# Patient Record
Sex: Male | Born: 1950 | Race: White | Hispanic: No | Marital: Married | Smoking: Former smoker
Health system: Southern US, Community
[De-identification: ages and names within clinical notes are randomized; demographics above are authoritative.]

## PROBLEM LIST (undated history)

## (undated) DIAGNOSIS — F32A Depression, unspecified: Secondary | ICD-10-CM

## (undated) DIAGNOSIS — T8859XA Other complications of anesthesia, initial encounter: Secondary | ICD-10-CM

## (undated) DIAGNOSIS — M1990 Unspecified osteoarthritis, unspecified site: Secondary | ICD-10-CM

## (undated) DIAGNOSIS — R2689 Other abnormalities of gait and mobility: Secondary | ICD-10-CM

## (undated) DIAGNOSIS — K649 Unspecified hemorrhoids: Secondary | ICD-10-CM

## (undated) DIAGNOSIS — R200 Anesthesia of skin: Secondary | ICD-10-CM

## (undated) DIAGNOSIS — R202 Paresthesia of skin: Secondary | ICD-10-CM

## (undated) DIAGNOSIS — F329 Major depressive disorder, single episode, unspecified: Secondary | ICD-10-CM

## (undated) DIAGNOSIS — G4730 Sleep apnea, unspecified: Secondary | ICD-10-CM

## (undated) DIAGNOSIS — H269 Unspecified cataract: Secondary | ICD-10-CM

## (undated) DIAGNOSIS — M549 Dorsalgia, unspecified: Secondary | ICD-10-CM

## (undated) DIAGNOSIS — C801 Malignant (primary) neoplasm, unspecified: Secondary | ICD-10-CM

## (undated) DIAGNOSIS — T4145XA Adverse effect of unspecified anesthetic, initial encounter: Secondary | ICD-10-CM

## (undated) DIAGNOSIS — R51 Headache: Secondary | ICD-10-CM

## (undated) DIAGNOSIS — IMO0002 Reserved for concepts with insufficient information to code with codable children: Secondary | ICD-10-CM

## (undated) DIAGNOSIS — M48061 Spinal stenosis, lumbar region without neurogenic claudication: Secondary | ICD-10-CM

## (undated) DIAGNOSIS — E785 Hyperlipidemia, unspecified: Secondary | ICD-10-CM

## (undated) HISTORY — PX: OTHER SURGICAL HISTORY: SHX169

## (undated) HISTORY — PX: NASAL SEPTUM SURGERY: SHX37

## (undated) HISTORY — DX: Anesthesia of skin: R20.0

## (undated) HISTORY — PX: SKIN CANCER EXCISION: SHX779

## (undated) HISTORY — DX: Hyperlipidemia, unspecified: E78.5

## (undated) HISTORY — DX: Dorsalgia, unspecified: M54.9

## (undated) HISTORY — PX: CATARACT EXTRACTION: SUR2

## (undated) HISTORY — DX: Sleep apnea, unspecified: G47.30

## (undated) HISTORY — DX: Unspecified cataract: H26.9

## (undated) HISTORY — DX: Malignant (primary) neoplasm, unspecified: C80.1

## (undated) HISTORY — PX: KNEE ARTHROSCOPY: SUR90

## (undated) HISTORY — PX: FOOT FUSION: SHX956

## (undated) HISTORY — PX: APPENDECTOMY: SHX54

## (undated) HISTORY — DX: Paresthesia of skin: R20.2

## (undated) HISTORY — PX: LUMBAR DISC SURGERY: SHX700

## (undated) HISTORY — DX: Reserved for concepts with insufficient information to code with codable children: IMO0002

---

## 1997-08-23 ENCOUNTER — Ambulatory Visit (HOSPITAL_COMMUNITY): Admission: RE | Admit: 1997-08-23 | Discharge: 1997-08-23 | Payer: Self-pay | Admitting: Family Medicine

## 1999-03-06 ENCOUNTER — Ambulatory Visit (HOSPITAL_BASED_OUTPATIENT_CLINIC_OR_DEPARTMENT_OTHER): Admission: RE | Admit: 1999-03-06 | Discharge: 1999-03-06 | Payer: Self-pay | Admitting: Orthopedic Surgery

## 2002-01-12 ENCOUNTER — Ambulatory Visit (HOSPITAL_COMMUNITY): Admission: RE | Admit: 2002-01-12 | Discharge: 2002-01-12 | Payer: Self-pay | Admitting: Specialist

## 2002-01-12 ENCOUNTER — Encounter: Payer: Self-pay | Admitting: Specialist

## 2005-04-04 ENCOUNTER — Encounter: Payer: Self-pay | Admitting: Internal Medicine

## 2005-05-02 ENCOUNTER — Encounter: Payer: Self-pay | Admitting: Internal Medicine

## 2005-08-23 ENCOUNTER — Inpatient Hospital Stay (HOSPITAL_COMMUNITY): Admission: RE | Admit: 2005-08-23 | Discharge: 2005-08-24 | Payer: Self-pay | Admitting: Neurosurgery

## 2005-12-19 ENCOUNTER — Encounter: Payer: Self-pay | Admitting: Internal Medicine

## 2006-02-18 ENCOUNTER — Ambulatory Visit: Payer: Self-pay | Admitting: Internal Medicine

## 2006-05-28 ENCOUNTER — Ambulatory Visit: Payer: Self-pay | Admitting: Internal Medicine

## 2006-07-31 ENCOUNTER — Observation Stay (HOSPITAL_COMMUNITY): Admission: RE | Admit: 2006-07-31 | Discharge: 2006-08-01 | Payer: Self-pay | Admitting: Otolaryngology

## 2007-02-11 ENCOUNTER — Encounter: Admission: RE | Admit: 2007-02-11 | Discharge: 2007-02-11 | Payer: Self-pay | Admitting: Neurosurgery

## 2007-02-17 DIAGNOSIS — G4733 Obstructive sleep apnea (adult) (pediatric): Secondary | ICD-10-CM

## 2007-02-18 ENCOUNTER — Ambulatory Visit: Payer: Self-pay | Admitting: Internal Medicine

## 2007-03-06 ENCOUNTER — Inpatient Hospital Stay (HOSPITAL_COMMUNITY): Admission: RE | Admit: 2007-03-06 | Discharge: 2007-03-07 | Payer: Self-pay | Admitting: Neurosurgery

## 2007-07-16 ENCOUNTER — Encounter: Payer: Self-pay | Admitting: Internal Medicine

## 2008-01-25 ENCOUNTER — Encounter: Payer: Self-pay | Admitting: Internal Medicine

## 2008-02-18 ENCOUNTER — Ambulatory Visit: Payer: Self-pay | Admitting: Internal Medicine

## 2008-03-03 ENCOUNTER — Telehealth (INDEPENDENT_AMBULATORY_CARE_PROVIDER_SITE_OTHER): Payer: Self-pay | Admitting: *Deleted

## 2008-03-10 ENCOUNTER — Encounter: Payer: Self-pay | Admitting: Internal Medicine

## 2008-03-13 ENCOUNTER — Encounter: Payer: Self-pay | Admitting: Internal Medicine

## 2008-04-01 ENCOUNTER — Encounter: Payer: Self-pay | Admitting: Internal Medicine

## 2008-04-26 ENCOUNTER — Encounter: Admission: RE | Admit: 2008-04-26 | Discharge: 2008-04-26 | Payer: Self-pay | Admitting: Neurological Surgery

## 2008-05-20 ENCOUNTER — Telehealth (INDEPENDENT_AMBULATORY_CARE_PROVIDER_SITE_OTHER): Payer: Self-pay | Admitting: *Deleted

## 2009-02-16 ENCOUNTER — Ambulatory Visit: Payer: Self-pay | Admitting: Internal Medicine

## 2009-06-07 ENCOUNTER — Encounter: Payer: Self-pay | Admitting: Internal Medicine

## 2010-02-16 ENCOUNTER — Ambulatory Visit
Admission: RE | Admit: 2010-02-16 | Discharge: 2010-02-16 | Payer: Self-pay | Source: Home / Self Care | Attending: Internal Medicine | Admitting: Internal Medicine

## 2010-03-14 NOTE — Assessment & Plan Note (Signed)
Summary: 60 YEAR/APC   Visit Type:  Follow-up PCP:  kindl  Chief Complaint:  yearly follow up visit .  History of Present Illness: Current Problems:  CORONARY HEART DISEASE (ICD-V17.3) SLEEP APNEA, OBSTRUCTIVE (ICD-327.23)  02/18/07- For low back spine surgery- Dr. Newell Coral. CPAP fine 14 cwp Am Home  Patient comfortable but beginning to see increasing nocturia again and maybe a little more tired. Dr. Jenne Pane did nasal surgery in July 2008.- ? Septoplasty and SMR. More nasal congestion few months without sneeze. No feedback about snore etc at home. Post op home AHI was 9.1- most recently at 13.1 He asks about trial 16cwp.  02/18/08-OSA He adjusts his own machine. Uncomfortable higher than 16 cwp with too much leak. Back pain requires frequent position changes. Using nasal pilows mask. No longer falling asleep driving and less nocturia. Discussed treatment options.          Prior Medications Reviewed Using: Patient Recall  Updated Prior Medication List: PAXIL 20 MG  TABS (PAROXETINE HCL) Take 1 by mouth once daily ZOCOR 10 MG TABS (SIMVASTATIN) take 1 by mouth once daily MULTIVITAMINS   TABS (MULTIPLE VITAMIN) take 1po once daily * CPAP 18 CWP [facility] PATIENT  CALTRATE 600+D 600-400 MG-UNIT TABS (CALCIUM CARBONATE-VITAMIN D) take 1 by mouth once daily  Current Allergies (reviewed today): ! BETADINE  Past Medical History:    Reviewed history from 02/18/2007 and no changes required:       Lumbar disk surgery L2-L3       Thoracic spine surgery- spur       appendectomy       bilateral knee arthroscopies       Sleep Apnea   Family History:    Coronary Heart Disease    Mother-cancer    Brother - OSA  Social History:    Reviewed history from 02/18/2007 and no changes required:       Patient states former smoker.        married    Review of Systems      See HPI  The patient denies anorexia, fever, weight loss, weight gain, vision loss, decreased hearing, hoarseness,  chest pain, syncope, dyspnea on exertion, peripheral edema, prolonged cough, headaches, hemoptysis, abdominal pain, melena, hematochezia, severe indigestion/heartburn, hematuria, incontinence, muscle weakness, suspicious skin lesions, transient blindness, difficulty walking, depression, unusual weight change, abnormal bleeding, enlarged lymph nodes, and angioedema.     Vital Signs:  Patient Profile:   60 Years Old Male Weight:      212 pounds O2 Sat:      98 % O2 treatment:    Room Air Pulse rate:   54 / minute BP sitting:   122 / 76  (left arm) Cuff size:   regular  Vitals Entered By: Reynaldo Minium CMA (February 18, 2008 9:09 AM)             Comments Medications reviewed with patient Reynaldo Minium CMA  February 18, 2008 9:09 AM      Physical Exam  General: A/Ox3; pleasant and cooperative, NAD, SKIN: no rash, lesions NODES: no lymphadenopathy HEENT: [place]/AT, EOM- WNL, Conjuctivae- clear, PERRLA, TM-WNL, Nose- clear, Throat- clear and wnl, Melampatti III NECK: Supple w/ fair ROM, JVD- none, normal carotid impulses w/o bruits Thyroid- normal to palpation CHEST: Clear to P&A HEART: RRR, no m/g/r heard ABDOMEN: Soft and nl; nml bowel sounds; no organomegaly or masses noted ZOX:WRUE, nl pulses, no edema  NEURO: Grossly intact to observation  Impression & Recommendations:  Problem # 1:  SLEEP APNEA, OBSTRUCTIVE (ICD-327.23) Will see if he can be more comfortable with bipap. Long talk.- 20 minures, about treatment options.  Medications Added to Medication List This Visit: 1)  Zocor 10 Mg Tabs (Simvastatin) .... Take 1 by mouth once daily 2)  Cpap 16 Cwp [facility] Patient  .... He sets his own pressure 3)  Caltrate 600+d 600-400 Mg-unit Tabs (Calcium carbonate-vitamin d) .... Take 1 by mouth once daily   Patient Instructions: 1)  Please schedule a follow-up appointment in 1 year. 2)  See [facility] about BiLevel titration.   ]

## 2010-03-14 NOTE — Letter (Signed)
Summary: CMN for CPAP Supplies/[facility]  CMN for CPAP Supplies/[facility]   Imported By: Sherian Rein 06/13/2009 07:56:50  _____________________________________________________________________  External Attachment:    Type:   Image     Comment:   External Document

## 2010-03-14 NOTE — Assessment & Plan Note (Signed)
Summary: f/u 1 yr///kp   Primary Provider/Referring Provider:  kindl  CC:  Yearly Follow up.  wearing vpap mostly everynight for approx 9hours/night.  no problems with vpap.  No complaints. .  History of Present Illness:  02/18/07- For low back spine surgery- Dr. Newell Coral. CPAP fine 14 cwp Am Home  Patient comfortable but beginning to see increasing nocturia again and maybe a little more tired. Dr. Jenne Pane did nasal surgery in July 2008.- ? Septoplasty and SMR. More nasal congestion few months without sneeze. No feedback about snore etc at home. Post op home AHI was 9.1- most recently at 13.1 He asks about trial 16cwp.  02/18/08-OSA He adjusts his own machine. Uncomfortable higher than 16 cwp with too much leak. Back pain requires frequent position changes. Using nasal pilows mask. No longer falling asleep driving and less nocturia. Discussed treatment options.   February 16, 2009-OSA Uses VPAP machine- he sets it himself (Advanced), with full face mask which helps the leak. He brings record showing AHI 6.7/hr. He still moves in sleep a lot due to his chronic back pain. He feels better off, no daytime somnolence and fewer wakings at night for bathroom. No snore unless he has a bad cold and omits the mask. Exhaust air bothers wife. Incidental mild cold.    Current Medications (verified): 1)  Paxil 20 Mg  Tabs (Paroxetine Hcl) .... Take 1 By Mouth Once Daily 2)  Zocor 10 Mg Tabs (Simvastatin) .... Take 1 By Mouth Once Daily 3)  Multivitamins   Tabs (Multiple Vitamin) .... Take 1po Once Daily 4)  Vpap .... He Sets His Own Pressure 5)  Caltrate 600+d 600-400 Mg-Unit Tabs (Calcium Carbonate-Vitamin D) .... Take 1 By Mouth Two Times A Day 6)  Glucosamine-Chondroitin  Caps (Glucosamine-Chondroit-Vit C-Mn) .Marland Kitchen.. 1 Tab Two Times A Day 7)  Androgel Pump 1 % Gel (Testosterone) .... 4 Pumps Once Daily  Allergies (verified): 1)  ! Betadine  Past History:  Past Medical History: Last updated:  02/18/2008 Lumbar disk surgery L2-L3 Thoracic spine surgery- spur appendectomy bilateral knee arthroscopies Sleep Apnea  Family History: Last updated: 02/18/2008 Coronary Heart Disease Mother-cancer Brother - OSA  Social History: Last updated: 02/16/2009 Patient states former smoker.  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    Vital Signs:  Patient profile:   60 year old male Height:      75 inches Weight:      206.38 pounds BMI:     25.89 O2 Sat:      98 % on Room air Pulse rate:   51 / minute BP sitting:   118 / 82  (left arm) Cuff size:   regular  Vitals Entered By: Gweneth Dimitri RN (February 16, 2009 9:06 AM)  O2 Flow:  Room air CC: Yearly Follow up.  wearing vpap mostly everynight for approx 9hours/night.  no problems with vpap.  No complaints.  Comments Medications reviewed with patient Gweneth Dimitri RN  February 16, 2009 9:07 AM    Physical Exam  Additional Exam:  General: A/Ox3; pleasant and cooperative, NAD, trim SKIN: no rash, lesions NODES: no lymphadenopathy HEENT: [place]/AT, EOM- WNL, Conjuctivae- clear, PERRLA, TM-WNL, Nose- clear, Throat- clear and wnl, Melampatti II NECK: Supple w/ fair ROM, JVD- none, normal carotid impulses w/o bruits Thyroid- normal to palpation CHEST: Clear to P&A HEART: RRR, no m/g/r heard ABDOMEN: Soft and nl; nml bowel sounds; ZOX:WRUE, nl pulses, no edema  NEURO: Grossly intact to  observation      Impression & Recommendations:  Problem # 1:  SLEEP APNEA, OBSTRUCTIVE (ICD-327.23)  Sleep quality is significantly impaired by his chronic back pain and probably is as good as it is going to  be. He does very well with his VPAP. We reviewed this carefully.  Medications Added to Medication List This Visit: 1)  Vpap  .... He sets his own pressure 2)  Vpap Insp 14.2/ , Exp 9.2 Advanced  .... He sets his own pressure 3)  Caltrate 600+d 600-400 Mg-unit Tabs (Calcium carbonate-vitamin d) .... Take 1 by mouth two times a day 4)  Glucosamine-chondroitin Caps (Glucosamine-chondroit-vit c-mn) .Marland Kitchen.. 1 tab two times a day 5)  Androgel Pump 1 % Gel (Testosterone) .... 4 pumps once daily  Other Orders: Est. Patient Level III (45409)  Patient Instructions: 1)  Schedule return in one year, earlier if needed 2)  continue VPAP- let me know if there are changes or problems   Immunization History:  Influenza Immunization History:    Influenza:  historical (11/12/2008)

## 2010-03-16 NOTE — Assessment & Plan Note (Signed)
Summary: 1 YEAR/ MBW   Primary Provider/Referring Provider:  Larina Earthly  CC:  Yearly follow up visit-sleep apnea.  History of Present Illness: 02/18/08-OSA He adjusts his own machine. Uncomfortable higher than 16 cwp with too much leak. Back pain requires frequent position changes. Using nasal pilows mask. No longer falling asleep driving and less nocturia. Discussed treatment options.   February 16, 2009-OSA Uses VPAP machine- he sets it himself (Advanced), with full face mask which helps the leak. He brings record showing AHI 6.7/hr. He still moves in sleep a lot due to his chronic back pain. He feels better off, no daytime somnolence and fewer wakings at night for bathroom. No snore unless he has a bad cold and omits the mask. Exhaust air bothers wife. Incidental mild cold.  February 16, 2010- OSA Nurse-CC: Yearly follow up visit-sleep apnea He brings his own download and continues to use his VPAP device all night every night. Pressure is usually around 14 cwp with AHI around 6. He is pleased with it. We reviewed his line item report of machine function in detail.  No major heatlth issues otherwise in the past year.  We had him listed as having hx of CAD, but he says that was family hx, not personal hx of CAD himself, just hyperlipidemia.      Preventive Screening-Counseling & Management  Alcohol-Tobacco     Smoking Status: quit     Packs/Day: 1.0     Year Started: 1975     Year Quit: 1983     Pack years: 10 years 1 pack daily  Current Medications (verified): 1)  Paxil 20 Mg  Tabs (Paroxetine Hcl) .... Take 1 By Mouth Once Daily 2)  Crestor 20 Mg Tabs (Rosuvastatin Calcium) .... Take 1  By Mouth Once Daily 3)  Multivitamins   Tabs (Multiple Vitamin) .... Take 1po Once Daily 4)  Vpap Insp 14.2/ , Exp 9.2 Advanced .... He Sets His Own Pressure 5)  Caltrate 600+d 600-400 Mg-Unit Tabs (Calcium Carbonate-Vitamin D) .... Take 1 By Mouth Two Times A Day 6)  Glucosamine-Chondroitin   Caps (Glucosamine-Chondroit-Vit C-Mn) .Marland Kitchen.. 1 Tab Two Times A Day 7)  Androgel Pump 1 % Gel (Testosterone) .... 4 Pumps Once Daily 8)  Naprosyn 500 Mg Tabs (Naproxen) .... Take 1 By Mouth Two Times A Day 9)  Fish Oil 1000 Mg Caps (Omega-3 Fatty Acids) .... Take 1 By Mouth Once Daily  Allergies (verified): 1)  ! Betadine  Past History:  Past Surgical History: Last updated: 02/16/2009 Lumbar disc surgery Thoracic vertebral decompression Appendectomy Knee arthroscopy x 2 Fusion right great toe left cataract nasal septoplasty  Family History: Last updated: 02/16/2010 Coronary Heart Disease Mother-cancer Brother - OSA Brother- died MI, ETOH Father- died MI, DM  Social History: Last updated: 02/16/2009 Patient states former smoker.  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    Vital Signs:  Patient profile:   60 year old male Height:      75 inches  Weight:      208.13 pounds BMI:     26.11 O2 Sat:      97 % on Room air Pulse rate:   50 / minute BP sitting:   138 / 62  (left arm) Cuff size:   regular  Vitals Entered By: Reynaldo Minium CMA (February 16, 2010 9:04 AM)  O2 Flow:  Room air CC: Yearly follow up visit-sleep apnea   Physical Exam  Additional Exam:  General: A/Ox3; pleasant and cooperative, NAD, trim SKIN: no rash, lesions NODES: no lymphadenopathy HEENT: [place]/AT,  EOM- WNL, Conjuctivae- clear, PERRLA, TM-WNL, Nose- mucus bridging, Throat- clear and wnl, Mallampatii II NECK: Supple w/ fair ROM, JVD- none, normal carotid impulses w/o bruits Thyroid- normal to palpation CHEST: Clear to P&A HEART: RRR, no m/g/r heard ABDOMEN: Soft and nl; nml bowel sounds; ZDG:UYQI, nl pulses, no edema  NEURO: Grossly intact to observation      Impression & Recommendations:  Problem # 1:  SLEEP APNEA, OBSTRUCTIVE (ICD-327.23)  Good compliance and control with VPAP- he really has done very well.  We discussed and compared modalities and he is comfotable with what he has.   Orders: Est. Patient Level III (34742)  Problem # 2:  CORONARY HEART DISEASE (ICD-V17.3) Family hx only.  Medications Added to Medication List This Visit: 1)  Crestor 20 Mg Tabs (Rosuvastatin calcium) .... Take 1  by mouth once daily 2)  Naprosyn 500 Mg Tabs (Naproxen) .... Take 1 by mouth two times a day 3)  Fish Oil 1000 Mg Caps (Omega-3 fatty acids) .... Take 1 by mouth once daily  Patient Instructions: 1)  Please schedule a follow-up appointment in 1 year. 2)  Continue VPAP- please call as needed.

## 2010-06-27 NOTE — Op Note (Signed)
NAMETARIG, ZIMMERS NO.:  [id number]   MEDICAL RECORD NO.:  [id number]          PATIENT TYPE:  INP   LOCATION:  2899                         FACILITY:  [facility]   PHYSICIAN:  Hewitt Shorts, M.D.DATE OF BIRTH:  1950/12/06   DATE OF PROCEDURE:  DATE OF DISCHARGE:                               OPERATIVE REPORT   PREOPERATIVE DIAGNOSIS:  Thoracic stenosis, thoracic spondylosis for the  myelopathy, and thoracic degenerative disk disease with myelopathy.   POSTOPERATIVE DIAGNOSIS:  Thoracic stenosis, thoracic spondylosis for  the myelopathy, and thoracic degenerative disk disease with myelopathy.   PROCEDURE:  T9-10 decompression with laminectomy and decompression of  thecal sac and nerve roots at T9, T10, and T11, with microdissection and  a T9-T11 posterolateral arthrodesis with radius posterior  instrumentation and locally harvested morcellized autograft.   SURGEON:  Hewitt Shorts, M.D.   ASSISTANT:  Hilda Lias, M.D.   ANESTHESIA:  General endotracheal anesthesia.   INDICATIONS FOR PROCEDURE:  The patient is a 60 year old man who  presented with myelopathy, MRI of the thoracic spine revealed severe  stenosis at the T9-T10 level secondary to spondylosis and degenerative  disc disease, a small central disk protrusion, and early stenosis at T10-  T11 level again due to the spondylosis and degenerative disc disease.  Decision was made to proceed with decompression of the thecal sac and  nerve roots and stabilization.   DESCRIPTION OF PROCEDURE:  The patient brought to the operating room,  placed under a general endotracheal anesthesia.  The patient was turned  to the prone position on the [place] table with bony prominences padded,  the thoracolumbar region was prepped with DuraPrep and draped in a  sterile fashion.  X-rays were taken to localize the T9 to T11 level.  The midline was infiltrated with local anesthetic with epinephrine and a  midline  incision was made over the T9 to T11 at level and dissection was  carried to the subcutaneous tissue, bipolar electrocautery to maintain  hemostasis.  Dissection was carried down to the thoracic fascia, which  was incised bilaterally, the parathoracic musculature was dissected with  spinous process of lamina in subperiosteal fashion.  A self-retaining  retractor was placed and additional x-rays and C-arm imaging was used to  confirm localization of T9, T10, and T11.  Then dissection was carried  out laterally exposing the transverse process and posterior aspect of  costovertebral junction.  Once the localization was confirmed, we  proceeded with decompression.   Laminectomy was performed using double action rongeurs, the X-Max drill,  and Kerrison punches, using magnification and microdissection.  There  was severe stenosis at the T9 and T10 level that was carefully  decompressed.  The dissection was carried out laterally with a  significant spondylitic overgrowth, and we were able to decompress, not  only the thecal sac, but actually the nerve roots.  At T10-11, the  stenosis was more prominent at the right side.  We insured that the  thecal sac and nerve roots were decompressed bilaterally.  Once the  decompression was completed, the wound was irrigated with Bacitracin  solution, checked for hemostasis, which was established by use of the  Gelfoam with thrombin and then we proceeded with the arthrodesis.  The C-  arm fluoroscope was draped upon the field and we identified the pedicle  entry site posteriorly at T9, T10, and T11.  The pedicle entry site was  entered with the X-Max drill and then using a pedicle probe each of the  sixth pedicles was carefully probed.  We then turned the C-arm to the  lateral trajectory and checked the depth, advanced each of the pedicle  probes to proper depth and then proceeded with placement of the pedicle  screws.  Each pedicle was examined with a bulb  probe.  Good bony  surfaces were noted.  It was then tapped to the 5.25 mm tap.  Again  examined with the bulb, good threading was noted, as well as good bony  surfaces were noted, and then we placed a 5.75 x 40 mm screw at each  pedicle.  Once all 6 pedicles screws were in place, we selected an 18-mm  straight rod.  It was placed within the screw heads and secured with  locking caps, which were subsequently tightened against the contour  torque.  We then decorticated the facet joints bilaterally and packed  the facets with the locally harvested morselized autograft harvested  from the laminectomy.  Once the bone graft was placed, we checked the  spinal canal to ensure that the none of the bone graft had fallen within  it and then reconfirmed hemostasis and proceeded with closure.  The  paraspinal muscles were approximated with interrupted inverted 1 Vicryl  sutures.  The deep fascia was closed with interrupted inverted 1 Vicryl  sutures.  Subcutaneous and subcuticular layer closed with interrupted  inverted 3-0 running Vicryl sutures.  The skin was reapproximated with  surgical staples.  The wound was dressed with Adaptic and sterile gauze  and Hypafix.  The procedure was tolerated well.  The estimated blood  loss was 200 mL.  We did use a cell saver during the procedure, but the  cell saver technician felt that there was insufficient blood loss that  has been down the collected material and therefore was none was  processed.  Sponge and needle counts were correct.  Following surgery,  the patient was to be turned back to supine position reversed from the  anesthetic, extubated, and transferred to the recovery room for further  care.      Hewitt Shorts, M.D.  Electronically Signed     RWN/MEDQ  D:  03/06/2007  T:  03/07/2007  Job:  [id number]

## 2010-06-27 NOTE — Op Note (Signed)
[name], [name]               ACCOUNT NO.:  [id number]   MEDICAL RECORD NO.:  [id number]          PATIENT TYPE:  AMB   LOCATION:  SDS                          FACILITY:  [facility]   PHYSICIAN:  Antony Contras, MD     DATE OF BIRTH:  11-Oct-1950   DATE OF PROCEDURE:  07/31/2006  DATE OF DISCHARGE:                               OPERATIVE REPORT   PREOPERATIVE DIAGNOSES:  1. Obstructive sleep apnea.  2. Septal deviation.  3. Inferior turbinate hypertrophy.   POSTOPERATIVE DIAGNOSES:  1. Obstructive sleep apnea.  2. Septal deviation.  3. Inferior turbinate hypertrophy.   PROCEDURE:  1. Septoplasty.  2. Bilateral inferior turbinate submucous resection.   SURGEON:  Antony Contras, MD   ANESTHESIA:  General endotracheal anesthesia.   COMPLICATIONS:  None.   INDICATION:  The patient is a 60 year old white male with a history of  obstructive sleep apnea who had a need for increasing pressure on his  CPAP machine.  His left nasal passage obstructs when he lies on his left  side and this has been going on for a couple of years.  He is found to  have a deviated nasal septum and turbinate hypertrophy.  He presents to  the operating room for surgical management.   FINDINGS:  The nasal septum has spurs to both sides running along the  inferior portion of the septum.  The septum deviates towards the left  posteriorly.  The inferior turbinates were found to be moderately  enlarged.   DESCRIPTION OF PROCEDURE:  The patient was identified in the holding  room.  Informed consent having been obtained, the patient was moved to  the operative suite and put on the operating room table in the supine  position.  Anesthesia was induced and the patient was intubated by the  anesthesia team without difficulty.  The patient was given intravenous  antibiotics and steroids during the case.  The eye was taped closed and  the face was prepped and draped in a sterile fashion.  Afrin soaked  pledgets  were placed in both sides of the nose and left in place for  several minutes.  The pledgets were removed and the nasal septum was  injected on both sides using 1% lidocaine with 1:100,000 epinephrine.  After replacing the Afrin pledgets for several minutes, a Killian  incision was made on the right side using a 15-blade scalpel.  A  subperichondrial flap was then elevated using caudal and then freer  elevators.  This extended back beyond the bony cartilaginous junction.  The cartilage was then divided using the freer elevator anterior to the  bony cartilaginous junction at the portion of deflection slightly  towards the right side.  A subperichondrial/subperiosteal flap was then  elevated on the opposite side through the cartilage incision.  A rongeur  was used to make a cut through the superior bone and cartilage and the  posterior bone and cartilage was then removed in a piecemeal fashion  using duck bill and Takahashi forceps.  This removed the posterior  deflection.  The flaps were then extended inferiorly  exposing the  inferior portion of the cartilage as well as the inferior septal bone.  These were removed in a piecemeal fashion as well as using an osteotome  on the bone.  This reduced the bony spurs on both sides.  Afrin pledgets  were placed within the septal cavity for a couple of minutes and then  removed.  The inferior turbinates were injected with 1% lidocaine with  1:100,000 of epinephrine.  Stab incisions were made anteriorly using a  15 blade scalpel and a freer elevator was then used to elevate soft  tissues off the underlying bone.  The straight microdebrider was then  used within the pocket to remove the spongy layer of tissue maintaining  the mucosa.  Both turbinates were pressed laterally.  The nose was then  suctioned and Doyle splints coated in bacitracin ointment were placed in  both sides of the nose.  The Killian incision was closed with 4-0  chromic in a simple  interrupted fashion.  The stents were then placed in  the proper position and secured at the anterior septum using 2-0 chromic  in a single mattress through and through suture.  The nose and throat  were then suctioned.  The patient was turned back to anesthesia for wake  up.  He was extubated and admitted to the recovery room in stable  condition.      Antony Contras, MD  Electronically Signed     DDB/MEDQ  D:  07/31/2006  T:  07/31/2006  Job:  [id number]

## 2010-06-30 NOTE — Op Note (Signed)
[name], [name] NO.:  [id number]   MEDICAL RECORD NO.:  [id number]          PATIENT TYPE:  INP   LOCATION:  3030                         FACILITY:  [facility]   PHYSICIAN:  Hewitt Shorts, M.D.DATE OF BIRTH:  Jan 31, 1951   DATE OF PROCEDURE:  DATE OF DISCHARGE:  08/24/2005                                 OPERATIVE REPORT   PREOPERATIVE DIAGNOSES:  1.  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  2.  Lumbar spondylosis.  3.  Lumbar degenerative disk disease.  4.  Lumbar radiculopathy.   POSTOPERATIVE DIAGNOSES:  1.  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  2.  Lumbar spondylosis.  3.  Lumbar degenerative disk disease.  4.  Lumbar radiculopathy.   PROCEDURE:  Right L3-4 extraforaminal microdiskectomy with microdissection.   SURGEON:  Dr. Newell Coral.   ASSISTANT:  Dr. Venetia Maxon and Dr. Webb Silversmith.   INDICATIONS FOR PROCEDURE:  This patient is a 60 year old male who presented  with right lumbar radiculopathy, which was found to be due to a right L3-4  extraforaminal lumbar disk herniation compressing the exiting L3 nerve root.  The decision was made to proceed with elective extraforaminal  microdiskectomy.   PROCEDURE:  The patient was brought to the operating room and placed under  general endotracheal anesthesia.  The patient was turned in the prone  position.  Lumbar region was prepped with DuraPrep and draped in a sterile  fashion.  The x-ray was taken, the L3-4 level identified, the midline was  infiltrated with local anesthetic with epinephrine and then a midline  incision made, carried down to the subcutaneous tissue.  Bipolar cautery and  electrocautery was used to maintain hemostasis.  Dissection was carried down  to the lumbar fascia, which was incised on the right side of the midline and  the paraspinal muscle dissected to the spinous processes and lamina to the  subperiosteal fascia.  This was carried out over the facet complexes and we  identified the transverse process of L3 and then we dissected in the right  L3-4 extraforaminal space and x-ray was taken to confirm our localization,  and then a lateral facetectomy was performed.  The microscope was draped and  brought onto the field to provide the additional magnification, illumination  and visualization, and the remainder of the decompression was performed  using microdissection microsurgical technique.  The lateral facetectomy was  performed using the awl and Kerrison punches.  Bipolar cautery was used to  maintain hemostasis.  Dissection was carried out inferomedially.  We  identified the right L3 nerve root and we eventually identified the annulus  of the L3-4 disk and then gently retracted the nerve root and exposed the  disk herniation.  This was removed in a piecemeal fashion, but the total  amount of fragment that has herniated was significant and causing  significant nerve recompression.  As the diskectomy was performed, the nerve  root clearly became under less tension.  We entered into the disk space by  massaging the annulus and continued with the diskectomy.  We removed the  loose fragments and disk material from within the disk  space.  Thorough  diskectomy was performed with removal of all loose fragments and disk  material from both the extraforaminal space and the disk space and good  decompression of the right L3 nerve root was achieved.  Hemostasis was  established with the use of bipolar cautery as well as Gelfoam soaked in  thrombin.  This Gelfoam was removed and hemostasis was confirmed.  The wound  was irrigated with Bacitracin as it had been done numerous times through the  procedure and then once hemostasis was confirmed and the diskectomy  completed, we instilled 2 mL of fentanyl and 80 mg of Depo-Medrol into the  extraforaminal space around the right L3 nerve root and then we proceeded  with closure.  The deep fascia was closed with interrupted  undyed 1-0 Vicryl  sutures, the subcutaneous and subcuticular were closed with undyed 2-0  interrupted Vicryl sutures, and the skin was re-approximated with Dermabond.  The procedure was tolerated well.  The estimated blood loss was less than 50  mL.  Sponge and needle counts were correct.  Following surgery, the patient  was reversed in anesthetic, extubated, and transferred to the recovery room  for further care, where he was noted to be moving all four extremities to  command.      Hewitt Shorts, M.D.  Electronically Signed     RWN/MEDQ  D:  08/23/2005  T:  08/24/2005  Job:  [id number]

## 2010-06-30 NOTE — Assessment & Plan Note (Signed)
[name] [name]                             PULMONARY OFFICE NOTE   NAME:[name] [name] ZETINA                MRN:          [id number]  DATE:02/18/2006                            DOB:          1950-10-30    PULMONARY SLEEP MEDICINE CONSULTATION:   PROBLEM:  Sleep medicine consultation at the kind request of [name] [name]  for this 60 year old man with obstructive sleep apnea.   HISTORY:  His wife initially had noticed loud snoring and witnessed  apneas.  His primary physician at that time had sent him to [facility] where he had an initial study in February of 2007  recording mild obstructive apnea with an index of 13 per hour.  Subsequently he had a CPAP titration study and apnea index of 13 per  hour.  He then had a CPAP titration study on May 03, 1998 to 10 cm of  water pressure.  He eventually went back for a follow up CPAP titration  on December 20, 1998 and was titrated first to CPAP and then to B-PAP at  a pressure of inspiratory 16, expiratory 8.  He has continued using CPAP  at 10 CWP and owns his machine.  He has been frustrated that he has not  had an opportunity to discuss his sleep medicine management or his  machines with a physician, thus being contacted periodically by home  care companies.  He does not want to work with [name] any longer.  Bedtime is estimated 10 to 11 p.m., estimating 30 minutes sleep latency.  He may wake briefly twice during the night, but he no longer has the  frequent nocturia that he noted before CPAP.  His weight has dropped  about 10 pounds.   MEDICATIONS:  1. Paxil 20 mg.  2. Celebrex 200 mg.  3. Lipitor 10 mg.  4. Glucosamine.  5. Multivitamins.   Drug intolerant of TOPICAL BETADINE.   REVIEW OF SYSTEMS:  Recent cold with sniffling and yellow discharge for  which he asks treatment since he is not getting better on his own.  Snoring and witnessed apnea.  He has daytime sleepiness  while driving,  difficulty maintaining sleep, dozing off.  No syncope, palpitation,  chest pain, shortness of breath or cough.  No peripheral edema.   PAST HISTORY:  Some basil cell skin cancers removed.  Lumbar spine  surgery in 2000. Appendectomy, arthroscopy for knees.  Bone spur surgery  on a toe.  He has had no ENT surgery, no thyroid, heart or lung disease.  He tolerated his lumbar spine surgery and anesthesia without difficulty.   SOCIAL HISTORY:  He quit smoking in 1983.  Social alcohol.  Married.   FAMILY HISTORY:  Nobody known to have sleep problems.  Father and  brother had heart disease.  Mother had cancer.   OBJECTIVE:  Weight 199 pounds.  BP 118/86, pulse 65, room air saturation  99%.  This is a tall, slender man, alert and comfortable.  HEENT:  Palate length 2/4.  Nasal airway not obstructed.  Tongue and  mandible normal.  No thyromegaly, strider or  neck vein distention.  Watery, sniffling and mild nasal stuffiness, consistent with a recent  cold.  CHEST:  Quiet, clear lung fields.  HEART:  Regular rhythm, no murmur, rub or gallop.  EXTREMITIES:  No tremors, cyanosis, clubbing or edema.   IMPRESSION:  1. Mild obstructive sleep apnea.  Baseline apnea-hypopnea index was 13      per hour in February 2007.  He is symptomatically satisfied with      control on continuous positive airway pressure at 10 CWP and owns      his own machine, but wishes to change home care companies.  We will      put him in touch with an alternative company and leave the pressure      at 10.  2. Viral syndrome.  Now developing some more purulent appearing      discharge, for which I will give him a Z-pack and encourage fluids.   PLAN:  1. Z-pack.  2. Change home care to company of his choice, but continue CPAP at 10.  3. Sleep hygiene, medical issues of sleep apnea and conservative      measures were reviewed.  4. Schedule return once in 2 months and then probably will be able to      see  him once a year, earlier p.r.n.     [name] D. [name] Hudson, MD, [name] [name], FACP  Electronically Signed    CDY/MedQ  DD: 02/18/2006  DT: 02/19/2006  Job #: [id number]   cc:   [name] [name], M.D.

## 2010-06-30 NOTE — Assessment & Plan Note (Signed)
[facility]                             PULMONARY OFFICE NOTE   NAME:[name], [name]                      MRN:          [id number]  DATE:05/28/2006                            DOB:          [birth year]    PROBLEM LIST:  Mild obstructive sleep apnea (13 per hour).   HISTORY:  He is happy with his change from [facility] to [facility]  and feels comfortable now with CPAP at 11 CWP. At this pressure he says  he is not snoring and sleeps okay but still can fall asleep anytime he  sits quietly. His left nostril closes often and he asked about  management for that. He does feel sleepy if he does not wear the CPAP,  but can drive long drives now. He is using a nasal pillow  mask. Auto-  titration had suggested a pressure around 16 CWP and we compared that to  his current settings.   MEDICATIONS:  1. Paxil 20 mg.  2. Lipitor 10 mg.  3. Glucosamine.  4. CPAP now at 11.   DRUG INTOLERANCE:  TOPICAL BETADINE.   OBJECTIVE:  VITAL SIGNS:  Weight 200 pounds, blood pressure 116/68,  pulse regular 57, room air saturation 99%.  GENERAL:  Tall, alert.  HEENT:  No pressure marks on his face. There is minimal nasal septal  deviation but he is not obstructed, there is no post nasal drip.'  LUNGS:  Clear.  HEART:  Sounds normal.   IMPRESSION:  1. Mild obstructive sleep apnea with residual daytime sleepiness.  2. Nasal congestion probably reflecting his mild septal deviation      based on persistent sensation that his left side closes.   PLAN:  1. [facility] will increase his CPAP from 11 to 14 for      trial looking at its impact on daytime sleepiness.  2. Try Nasonex once or twice each nostril daily.  3. Try Breathe Right strips.  4. Consider ENT referral for nasal obstruction, earlier p.r.n.     Clinton D. Maple Hudson, MD, Tonny Bollman, FACP  Electronically Signed    CDY/MedQ  DD: 05/28/2006  DT: 05/29/2006  Job #: [id number]   cc:   Quita Skye. Artis Flock,  M.D.

## 2010-06-30 NOTE — Op Note (Signed)
[place]  Patient:    [name]                       MRN: [id number] Proc. Date: 03/06/99 Adm. Date:  [id number] Attending:  Twana First                           Operative Report  PREOPERATIVE DIAGNOSIS:  Left knee medial meniscus tear.  POSTOPERATIVE DIAGNOSES: 1. Left knee medial meniscus tear. 2. 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  PROCEDURES: 1. Left knee EUA followed by arthroscopic partial medial meniscectomy. 2. Left knee chondroplasty and partial synovectomy.  SURGEON:  Elana Alm. Thurston Hole, M.D.  ASSISTANT:  Kirstin Adelberger, P.A.  ANESTHESIA:  General.  OPERATIVE TIME:  40 minutes.  COMPLICATIONS:  None.  INDICATIONS FOR PROCEDURE:  [name] is a 60 year old gentleman who has had 3-4 months of increasing left knee pain with signs and symptoms and an MRI documenting a medial meniscus tear that has failed conservative care and is now to undergo arthroscopy.  DESCRIPTION OF PROCEDURE:  [name] was brought to the operating room on March 06, 1999 and placed on the operating table in the supine position. After an adequate level of general anesthesia was obtained, his left knee was examined under anesthesia.  Range of motion from 0-130 degrees, 1+ crepitation, knee stable to ligamentous exam with normal patella tracking.  After this was done, the knee was prepped using sterile Hibiclens and alcohol and draped using sterile technique. Originally, through an inferolateral portal, the arthroscope with a pump attachment was placed, and through an inferomedial portal, an arthroscopic probe was placed. 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 The medial tibial plateau showed mild grade I and II changes.  The medial meniscus showed a complex tear of the  posterior medial horn, of which 40% was resected back to a stable rim.  The intercondylar notch was inspected.  The anterior and posterior cruciate ligaments were found to be normal.  The lateral compartment as inspected.  The lateral femoral condyle was intact.  The lateral tibial plateau  showed 25-30% grade III chondromalacia, which was debrided.  The lateral meniscus was probed, and this was found to be normal.  The patellofemoral joint was inspected, and the articular cartilage in this joint showed a grade III chondromalacia over 50-70% of the femoral groove, which was debrided.  The patellar articular cartilage showed mild grade II changes.  The patella tracked normally. Moderately thickened synovitis, and the medial and lateral gutters were debrided. Otherwise, they were free of pathology.  After this was done, and it was felt that all pathology had been satisfactorily addressed, the instruments were removed. The portals were closed with 3-0 nylon suture and injected with 0.25% Marcaine with  epinephrine and 5 mg of morphine.  Sterile dressings were applied, and the patient was awakened and taken to the recovery room in stable condition.  FOLLOWUP CARE:  [name] will be followed as an outpatient on Percocet and Naprosyn.  See him back in the office in a week for sutures out and follow up. DD:  03/06/99 TD:  03/06/99 Job: [id number]

## 2010-10-10 ENCOUNTER — Telehealth: Payer: Self-pay | Admitting: Internal Medicine

## 2010-10-10 DIAGNOSIS — G4733 Obstructive sleep apnea (adult) (pediatric): Secondary | ICD-10-CM

## 2010-10-10 NOTE — Telephone Encounter (Signed)
Per CY -ok to send order.  So order placed and sent to [facility]. Carron Curie, CMA

## 2010-10-10 NOTE — Telephone Encounter (Signed)
[place] and he needs an order sent to [facility] for CPAP POWER CHORD-DC 24 CONVERTER PROD CODE: [id number]. Please advise if ok to send order. Carron Curie, CMA

## 2010-11-02 LAB — BASIC METABOLIC PANEL
BUN: 13
CO2: 26
Calcium: 9.7
Chloride: 102
Creatinine, Ser: 0.86
GFR calc non Af Amer: >60
Glucose, Bld: 95
Potassium: 3.6
Sodium: 135

## 2010-11-02 LAB — CBC
HCT: 41.5
Hemoglobin: 14.3
MCHC: 34.5
MCV: 92.3
Platelets: 271
RBC: 4.49
RDW: 11.9
WBC: 5

## 2010-11-02 LAB — TYPE AND SCREEN
ABO/RH(D): A POS
Antibody Screen: NEGATIVE

## 2010-11-02 LAB — ABO/RH: ABO/RH(D): A POS

## 2010-11-29 LAB — CBC
Hemoglobin: 14.7
MCHC: 34
MCV: 93.8
RBC: 4.61
WBC: 8.9

## 2011-01-15 ENCOUNTER — Other Ambulatory Visit: Payer: Self-pay | Admitting: Neurological Surgery

## 2011-01-15 DIAGNOSIS — R51 Headache: Secondary | ICD-10-CM

## 2011-01-15 DIAGNOSIS — M542 Cervicalgia: Secondary | ICD-10-CM

## 2011-01-29 ENCOUNTER — Ambulatory Visit
Admission: RE | Admit: 2011-01-29 | Discharge: 2011-01-29 | Disposition: A | Discharge status: Home / Self Care | Payer: BC Managed Care – PPO | Source: Ambulatory Visit | Attending: Neurological Surgery | Admitting: Neurological Surgery

## 2011-01-29 DIAGNOSIS — M542 Cervicalgia: Secondary | ICD-10-CM

## 2011-01-29 DIAGNOSIS — R51 Headache: Secondary | ICD-10-CM

## 2011-02-15 ENCOUNTER — Encounter: Payer: Self-pay | Admitting: Internal Medicine

## 2011-02-16 ENCOUNTER — Ambulatory Visit (INDEPENDENT_AMBULATORY_CARE_PROVIDER_SITE_OTHER): Payer: BC Managed Care – PPO | Admitting: Internal Medicine

## 2011-02-16 ENCOUNTER — Encounter: Payer: Self-pay | Admitting: Internal Medicine

## 2011-02-16 VITALS — BP 108/68 | HR 54 | Ht 75.0 in | Wt 206.2 lb

## 2011-02-16 DIAGNOSIS — G4733 Obstructive sleep apnea (adult) (pediatric): Secondary | ICD-10-CM

## 2011-02-16 NOTE — Progress Notes (Signed)
02/16/11- 60 yoM former smoker, followed for OSA, complicated by arthritis in spine. LOV-02/16/2010 Continues using VPAP all night, every night. He sets his own pressure and brings his own download report which is filed. Compliance has been quite good. Pressure is around 14 with AHI 6.7 per hour. Had flu vaccine.  ROS-see HPI Constitutional:   No-   weight loss, night sweats, fevers, chills, fatigue, lassitude. HEENT:   No-  headaches, difficulty swallowing, tooth/dental problems, sore throat,       No-  sneezing, itching, ear ache, nasal congestion, post nasal drip,  CV:  No-   chest pain, orthopnea, PND, swelling in lower extremities, anasarca, dizziness, palpitations Resp: No-   shortness of breath with exertion or at rest.              No-   productive cough,  No non-productive cough,  No- coughing up of blood.              No-   change in color of mucus.  No- wheezing.   Skin: No-   rash or lesions. GI:  No-   heartburn, indigestion, abdominal pain, nausea, vomiting, diarrhea,                 change in bowel habits, loss of appetite GU:. MS:  No-   joint pain or swelling.  No- decreased range of motion.  No- back pain. Neuro-     nothing unusual Psych:  No- change in mood or affect. No depression or anxiety.  No memory loss.  OBJ General- Alert, Oriented, Affect-appropriate, Distress- none acute, tall, trim, comfortable appearing. Skin- rash-none, lesions- none, excoriation- none Lymphadenopathy- none Head- atraumatic            Eyes- Gross vision intact, PERRLA, conjunctivae clear secretions            Ears- Hearing, canals-normal            Nose- Clear, no-Septal dev, mucus, polyps, erosion, perforation             Throat- Mallampati II , mucosa clear , drainage- none, tonsils- atrophic Neck- flexible , trachea midline, no stridor , thyroid nl, carotid no bruit Chest - symmetrical excursion , unlabored           Heart/CV- RRR , no murmur , no gallop  , no rub, nl s1 s2                  - JVD- none , edema- none, stasis changes- none, varices- none           Lung- clear to P&A, wheeze- none, cough- none , dullness-none, rub- none           Chest wall-  Abd- Br/ Gen/ Rectal- Not done, not indicated Extrem- cyanosis- none, clubbing, none, atrophy- none, strength- nl Neuro- grossly intact to observation

## 2011-02-16 NOTE — Patient Instructions (Signed)
Continue VPAP/ Advanced   Please call as needed

## 2011-02-20 NOTE — Assessment & Plan Note (Signed)
Continues to demonstrate great compliance and control. He is doing very well. 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

## 2011-02-26 ENCOUNTER — Encounter: Payer: Self-pay | Admitting: Internal Medicine

## 2011-03-21 ENCOUNTER — Other Ambulatory Visit (HOSPITAL_COMMUNITY): Payer: Self-pay | Admitting: *Deleted

## 2011-03-21 ENCOUNTER — Encounter (HOSPITAL_COMMUNITY): Payer: Self-pay | Admitting: Pharmacy Technician

## 2011-03-22 ENCOUNTER — Encounter (HOSPITAL_COMMUNITY)
Admission: RE | Admit: 2011-03-22 | Discharge: 2011-03-22 | Disposition: A | Discharge status: Home / Self Care | Payer: BC Managed Care – PPO | Source: Ambulatory Visit | Attending: Neurological Surgery | Admitting: Neurological Surgery

## 2011-03-22 ENCOUNTER — Encounter (HOSPITAL_COMMUNITY): Payer: Self-pay

## 2011-03-22 ENCOUNTER — Other Ambulatory Visit: Payer: Self-pay

## 2011-03-22 HISTORY — DX: Unspecified osteoarthritis, unspecified site: M19.90

## 2011-03-22 HISTORY — DX: Depression, unspecified: F32.A

## 2011-03-22 HISTORY — DX: Major depressive disorder, single episode, unspecified: F32.9

## 2011-03-22 HISTORY — DX: Spinal stenosis, lumbar region without neurogenic claudication: M48.061

## 2011-03-22 HISTORY — DX: Headache: R51

## 2011-03-22 LAB — CBC
HCT: 43.7 % (ref 39.0–52.0)
Hemoglobin: 15.4 g/dL (ref 13.0–17.0)
MCH: 32.2 pg (ref 26.0–34.0)
MCV: 91.4 fL (ref 78.0–100.0)
Platelets: 164 10*3/uL (ref 150–400)
RBC: 4.78 MIL/uL (ref 4.22–5.81)

## 2011-03-22 LAB — BASIC METABOLIC PANEL
CO2: 28 mEq/L (ref 19–32)
Calcium: 10 mg/dL (ref 8.4–10.5)
Glucose, Bld: 80 mg/dL (ref 70–99)
Sodium: 140 mEq/L (ref 135–145)

## 2011-03-22 LAB — DIFFERENTIAL
Eosinophils Absolute: 0.1 10*3/uL (ref 0.0–0.7)
Eosinophils Relative: 2 % (ref 0–5)
Lymphs Abs: 1.5 10*3/uL (ref 0.7–4.0)
Monocytes Absolute: 0.5 10*3/uL (ref 0.1–1.0)
Monocytes Relative: 9 % (ref 3–12)

## 2011-03-22 NOTE — Pre-Procedure Instructions (Signed)
[address]  03/22/2011   Your procedure is scheduled on: Wed, WED, Feb 20  Report to [facility] at 0630 AM.  Call this number if you have problems the morning of surgery: [phone]   Remember:   Do not eat food:After Midnight.  May have clear liquids: up to 4 Hours before arrival.  Clear liquids include soda, tea, black coffee, apple or grape juice, broth.  Take these medicines the morning of surgery with A SIP OF WATER: *Paxil,**   Do not wear jewelry, make-up or nail polish.  Do not wear lotions, powders, or perfumes. You may wear deodorant.  Do not shave 48 hours prior to surgery.  Do not bring valuables to the hospital.  Contacts, dentures or bridgework may not be worn into surgery.  Leave suitcase in the car. After surgery it may be brought to your room.  For patients admitted to the hospital, checkout time is 11:00 AM the day of discharge.   Patients discharged the day of surgery will not be allowed to drive home.  Name and phone number of your driver: Alexis Frock**  Special Instructions: CHG Shower Use Special Wash: 1/2 bottle night before surgery and 1/2 bottle morning of surgery.   Please read over the following fact sheets that you were given: Pain Booklet, Coughing and Deep Breathing, MRSA Information and Surgical Site Infection Prevention

## 2011-04-03 MED ORDER — CEFAZOLIN SODIUM-DEXTROSE 2-3 GM-% IV SOLR
2.0000 g | Freq: Once | INTRAVENOUS | Status: AC
  Administered 2011-04-04: 2 g via INTRAVENOUS
  Filled 2011-04-03: qty 50

## 2011-04-04 ENCOUNTER — Ambulatory Visit (HOSPITAL_COMMUNITY): Payer: BC Managed Care – PPO

## 2011-04-04 ENCOUNTER — Encounter (HOSPITAL_COMMUNITY): Payer: Self-pay | Admitting: *Deleted

## 2011-04-04 ENCOUNTER — Encounter (HOSPITAL_COMMUNITY): Payer: Self-pay | Admitting: Anesthesiology

## 2011-04-04 ENCOUNTER — Other Ambulatory Visit: Payer: Self-pay | Admitting: Neurosurgery

## 2011-04-04 ENCOUNTER — Ambulatory Visit (HOSPITAL_COMMUNITY): Payer: BC Managed Care – PPO | Admitting: Anesthesiology

## 2011-04-04 ENCOUNTER — Observation Stay (HOSPITAL_COMMUNITY)
Admission: RE | Admit: 2011-04-04 | Discharge: 2011-04-06 | DRG: 758 | Disposition: A | Discharge status: Home / Self Care | Payer: BC Managed Care – PPO | Source: Ambulatory Visit | Attending: Neurological Surgery | Admitting: Neurological Surgery

## 2011-04-04 ENCOUNTER — Encounter (HOSPITAL_COMMUNITY)
Admission: RE | Disposition: A | Discharge status: Home / Self Care | Payer: Self-pay | Source: Ambulatory Visit | Attending: Neurological Surgery

## 2011-04-04 DIAGNOSIS — Y9210 Unspecified residential institution as the place of occurrence of the external cause: Secondary | ICD-10-CM | POA: Insufficient documentation

## 2011-04-04 DIAGNOSIS — IMO0002 Reserved for concepts with insufficient information to code with codable children: Secondary | ICD-10-CM | POA: Insufficient documentation

## 2011-04-04 DIAGNOSIS — E785 Hyperlipidemia, unspecified: Secondary | ICD-10-CM | POA: Insufficient documentation

## 2011-04-04 DIAGNOSIS — Z01810 Encounter for preprocedural cardiovascular examination: Secondary | ICD-10-CM | POA: Insufficient documentation

## 2011-04-04 DIAGNOSIS — R339 Retention of urine, unspecified: Secondary | ICD-10-CM | POA: Insufficient documentation

## 2011-04-04 DIAGNOSIS — Y838 Other surgical procedures as the cause of abnormal reaction of the patient, or of later complication, without mention of misadventure at the time of the procedure: Secondary | ICD-10-CM | POA: Insufficient documentation

## 2011-04-04 DIAGNOSIS — G4730 Sleep apnea, unspecified: Secondary | ICD-10-CM | POA: Insufficient documentation

## 2011-04-04 DIAGNOSIS — Z9889 Other specified postprocedural states: Secondary | ICD-10-CM

## 2011-04-04 DIAGNOSIS — Z01812 Encounter for preprocedural laboratory examination: Secondary | ICD-10-CM | POA: Insufficient documentation

## 2011-04-04 DIAGNOSIS — N9989 Other postprocedural complications and disorders of genitourinary system: Secondary | ICD-10-CM | POA: Insufficient documentation

## 2011-04-04 DIAGNOSIS — M48061 Spinal stenosis, lumbar region without neurogenic claudication: Principal | ICD-10-CM | POA: Insufficient documentation

## 2011-04-04 DIAGNOSIS — Z01818 Encounter for other preprocedural examination: Secondary | ICD-10-CM | POA: Insufficient documentation

## 2011-04-04 HISTORY — PX: LUMBAR LAMINECTOMY/DECOMPRESSION MICRODISCECTOMY: SHX5026

## 2011-04-04 SURGERY — LUMBAR LAMINECTOMY/DECOMPRESSION MICRODISCECTOMY 2 LEVELS
Anesthesia: General | Wound class: Clean

## 2011-04-04 MED ORDER — HYDROMORPHONE HCL PF 1 MG/ML IJ SOLN: 0.2500 mg | INTRAMUSCULAR | Status: DC | PRN

## 2011-04-04 MED ORDER — DEXAMETHASONE SODIUM PHOSPHATE 10 MG/ML IJ SOLN
INTRAMUSCULAR | Status: AC
  Administered 2011-04-04: 10 mg via INTRAVENOUS
  Filled 2011-04-04: qty 1

## 2011-04-04 MED ORDER — SODIUM CHLORIDE 0.9 % IJ SOLN
3.0000 mL | Freq: Two times a day (BID) | INTRAMUSCULAR | Status: DC
  Administered 2011-04-04 – 2011-04-06 (×5): 3 mL via INTRAVENOUS

## 2011-04-04 MED ORDER — ONDANSETRON HCL 4 MG/2ML IJ SOLN
INTRAMUSCULAR | Status: DC | PRN
  Administered 2011-04-04: 4 mg via INTRAVENOUS

## 2011-04-04 MED ORDER — SODIUM CHLORIDE 0.9 % IJ SOLN: 3.0000 mL | INTRAMUSCULAR | Status: DC | PRN

## 2011-04-04 MED ORDER — POTASSIUM CHLORIDE IN NACL 20-0.9 MEQ/L-% IV SOLN
INTRAVENOUS | Status: DC
Start: 2011-04-04 — End: 2011-04-06
  Administered 2011-04-04: 14:00:00 via INTRAVENOUS
  Filled 2011-04-04 (×6): qty 1000

## 2011-04-04 MED ORDER — ROCURONIUM BROMIDE 100 MG/10ML IV SOLN
INTRAVENOUS | Status: DC | PRN
  Administered 2011-04-04: 50 mg via INTRAVENOUS

## 2011-04-04 MED ORDER — MIDAZOLAM HCL 5 MG/5ML IJ SOLN
INTRAMUSCULAR | Status: DC | PRN
  Administered 2011-04-04: 2 mg via INTRAVENOUS

## 2011-04-04 MED ORDER — PHENOL 1.4 % MT LIQD: 1.0000 | OROMUCOSAL | Status: DC | PRN

## 2011-04-04 MED ORDER — PROPOFOL 10 MG/ML IV EMUL
INTRAVENOUS | Status: DC | PRN
  Administered 2011-04-04: 180 mg via INTRAVENOUS

## 2011-04-04 MED ORDER — SODIUM CHLORIDE 0.9 % IR SOLN
Status: DC | PRN
  Administered 2011-04-04: 09:00:00

## 2011-04-04 MED ORDER — LACTATED RINGERS IV SOLN
INTRAVENOUS | Status: DC | PRN
  Administered 2011-04-04 (×2): via INTRAVENOUS

## 2011-04-04 MED ORDER — BUPIVACAINE HCL (PF) 0.25 % IJ SOLN
INTRAMUSCULAR | Status: DC | PRN
  Administered 2011-04-04: 5 mL

## 2011-04-04 MED ORDER — GLYCOPYRROLATE 0.2 MG/ML IJ SOLN
INTRAMUSCULAR | Status: DC | PRN
  Administered 2011-04-04: .6 mg via INTRAVENOUS

## 2011-04-04 MED ORDER — DROPERIDOL 2.5 MG/ML IJ SOLN
INTRAMUSCULAR | Status: DC | PRN
  Administered 2011-04-04: 0.625 mg via INTRAVENOUS

## 2011-04-04 MED ORDER — MENTHOL 3 MG MT LOZG
1.0000 | LOZENGE | OROMUCOSAL | Status: DC | PRN
  Filled 2011-04-04: qty 9

## 2011-04-04 MED ORDER — THROMBIN 5000 UNITS EX SOLR
CUTANEOUS | Status: DC | PRN
  Administered 2011-04-04: 10000 [IU] via TOPICAL

## 2011-04-04 MED ORDER — SODIUM CHLORIDE 0.9 % IV SOLN
INTRAVENOUS | Status: AC
  Filled 2011-04-04: qty 500

## 2011-04-04 MED ORDER — BACITRACIN 50000 UNITS IM SOLR
INTRAMUSCULAR | Status: AC
  Filled 2011-04-04: qty 1

## 2011-04-04 MED ORDER — DEXAMETHASONE SODIUM PHOSPHATE 10 MG/ML IJ SOLN: 10.0000 mg | Freq: Once | INTRAMUSCULAR | Status: DC

## 2011-04-04 MED ORDER — VECURONIUM BROMIDE 10 MG IV SOLR
INTRAVENOUS | Status: DC | PRN
  Administered 2011-04-04: 4 mg via INTRAVENOUS
  Administered 2011-04-04: 2 mg via INTRAVENOUS

## 2011-04-04 MED ORDER — ACETAMINOPHEN 10 MG/ML IV SOLN
INTRAVENOUS | Status: AC
  Administered 2011-04-04: 1000 mg via INTRAVENOUS
  Filled 2011-04-04: qty 100

## 2011-04-04 MED ORDER — HEMOSTATIC AGENTS (NO CHARGE) OPTIME
TOPICAL | Status: DC | PRN
  Administered 2011-04-04: 1 via TOPICAL

## 2011-04-04 MED ORDER — MORPHINE SULFATE 4 MG/ML IJ SOLN: 1.0000 mg | INTRAMUSCULAR | Status: DC | PRN

## 2011-04-04 MED ORDER — ACETAMINOPHEN 325 MG PO TABS
650.0000 mg | ORAL_TABLET | ORAL | Status: DC | PRN
  Administered 2011-04-04 – 2011-04-05 (×2): 650 mg via ORAL
  Filled 2011-04-04 (×2): qty 2

## 2011-04-04 MED ORDER — LIDOCAINE HCL (CARDIAC) 20 MG/ML IV SOLN
INTRAVENOUS | Status: DC | PRN
  Administered 2011-04-04: 40 mg via INTRAVENOUS

## 2011-04-04 MED ORDER — METHOCARBAMOL 100 MG/ML IJ SOLN
500.0000 mg | Freq: Four times a day (QID) | INTRAVENOUS | Status: DC | PRN
  Filled 2011-04-04: qty 5

## 2011-04-04 MED ORDER — ASPIRIN EC 81 MG PO TBEC
81.0000 mg | DELAYED_RELEASE_TABLET | Freq: Every day | ORAL | Status: DC
  Administered 2011-04-04 – 2011-04-06 (×3): 81 mg via ORAL
  Filled 2011-04-04 (×3): qty 1

## 2011-04-04 MED ORDER — 0.9 % SODIUM CHLORIDE (POUR BTL) OPTIME
TOPICAL | Status: DC | PRN
  Administered 2011-04-04: 1000 mL

## 2011-04-04 MED ORDER — PAROXETINE HCL 20 MG PO TABS
20.0000 mg | ORAL_TABLET | Freq: Every day | ORAL | Status: DC
  Administered 2011-04-05 – 2011-04-06 (×2): 20 mg via ORAL
  Filled 2011-04-04 (×3): qty 1

## 2011-04-04 MED ORDER — HYDROCODONE-ACETAMINOPHEN 5-325 MG PO TABS: 1.0000 | ORAL_TABLET | ORAL | Status: DC | PRN

## 2011-04-04 MED ORDER — ACETAMINOPHEN 650 MG RE SUPP: 650.0000 mg | RECTAL | Status: DC | PRN

## 2011-04-04 MED ORDER — DEXTROSE 5 % IV SOLN
INTRAVENOUS | Status: DC | PRN
  Administered 2011-04-04: 09:00:00 via INTRAVENOUS

## 2011-04-04 MED ORDER — ZOLPIDEM TARTRATE 5 MG PO TABS
10.0000 mg | ORAL_TABLET | Freq: Every evening | ORAL | Status: DC | PRN
  Administered 2011-04-05: 10 mg via ORAL
  Filled 2011-04-04: qty 2

## 2011-04-04 MED ORDER — ONDANSETRON HCL 4 MG/2ML IJ SOLN: 4.0000 mg | Freq: Once | INTRAMUSCULAR | Status: DC | PRN

## 2011-04-04 MED ORDER — METHOCARBAMOL 500 MG PO TABS: 500.0000 mg | ORAL_TABLET | Freq: Four times a day (QID) | ORAL | Status: DC | PRN

## 2011-04-04 MED ORDER — THROMBIN 5000 UNITS EX SOLR
OROMUCOSAL | Status: DC | PRN
  Administered 2011-04-04: 11:00:00 via TOPICAL

## 2011-04-04 MED ORDER — FENTANYL CITRATE 0.05 MG/ML IJ SOLN
INTRAMUSCULAR | Status: DC | PRN
  Administered 2011-04-04 (×2): 50 ug via INTRAVENOUS
  Administered 2011-04-04: 150 ug via INTRAVENOUS
  Administered 2011-04-04 (×3): 50 ug via INTRAVENOUS
  Administered 2011-04-04: 150 ug via INTRAVENOUS

## 2011-04-04 MED ORDER — ONDANSETRON HCL 4 MG/2ML IJ SOLN: 4.0000 mg | INTRAMUSCULAR | Status: DC | PRN

## 2011-04-04 MED ORDER — NEOSTIGMINE METHYLSULFATE 1 MG/ML IJ SOLN
INTRAMUSCULAR | Status: DC | PRN
  Administered 2011-04-04: 5 mg via INTRAVENOUS

## 2011-04-04 MED ORDER — CEFAZOLIN SODIUM 1-5 GM-% IV SOLN
1.0000 g | Freq: Three times a day (TID) | INTRAVENOUS | Status: AC
  Administered 2011-04-04 (×2): 1 g via INTRAVENOUS
  Filled 2011-04-04 (×2): qty 50

## 2011-04-04 SURGICAL SUPPLY — 52 items
BAG DECANTER FOR FLEXI CONT (MISCELLANEOUS) ×2 IMPLANT
BENZOIN TINCTURE PRP APPL 2/3 (GAUZE/BANDAGES/DRESSINGS) ×2 IMPLANT
BUR MATCHSTICK NEURO 3.0 LAGG (BURR) ×2 IMPLANT
CANISTER SUCTION 2500CC (MISCELLANEOUS) ×2 IMPLANT
CLOSURE STERI STRIP 1/2 X4 (GAUZE/BANDAGES/DRESSINGS) ×2 IMPLANT
CLOTH BEACON ORANGE TIMEOUT ST (SAFETY) ×2 IMPLANT
CONT SPEC 4OZ CLIKSEAL STRL BL (MISCELLANEOUS) ×2 IMPLANT
DERMABOND ADHESIVE PROPEN (GAUZE/BANDAGES/DRESSINGS) ×1
DERMABOND ADVANCED .7 DNX6 (GAUZE/BANDAGES/DRESSINGS) ×1 IMPLANT
DRAPE LAPAROTOMY 100X72X124 (DRAPES) ×2 IMPLANT
DRAPE MICROSCOPE ZEISS OPMI (DRAPES) ×2 IMPLANT
DRAPE POUCH INSTRU U-SHP 10X18 (DRAPES) ×2 IMPLANT
DRAPE SURG 17X23 STRL (DRAPES) ×2 IMPLANT
DRESSING TELFA 8X3 (GAUZE/BANDAGES/DRESSINGS) ×2 IMPLANT
DRSG OPSITE 4X5.5 SM (GAUZE/BANDAGES/DRESSINGS) ×2 IMPLANT
DURAPREP 26ML APPLICATOR (WOUND CARE) ×2 IMPLANT
ELECT REM PT RETURN 9FT ADLT (ELECTROSURGICAL) ×2
ELECTRODE REM PT RTRN 9FT ADLT (ELECTROSURGICAL) ×1 IMPLANT
EVACUATOR 1/8 PVC DRAIN (DRAIN) ×2 IMPLANT
GAUZE SPONGE 4X4 16PLY XRAY LF (GAUZE/BANDAGES/DRESSINGS) IMPLANT
GLOVE BIO SURGEON STRL SZ8 (GLOVE) ×4 IMPLANT
GLOVE BIOGEL PI IND STRL 7.0 (GLOVE) ×1 IMPLANT
GLOVE BIOGEL PI IND STRL 8.5 (GLOVE) ×1 IMPLANT
GLOVE BIOGEL PI INDICATOR 7.0 (GLOVE) ×1
GLOVE BIOGEL PI INDICATOR 8.5 (GLOVE) ×1
GLOVE ECLIPSE 6.5 STRL STRAW (GLOVE) ×2 IMPLANT
GLOVE INDICATOR 8.5 STRL (GLOVE) ×2 IMPLANT
GLOVE SURG SS PI 8.0 STRL IVOR (GLOVE) ×8 IMPLANT
GOWN BRE IMP SLV AUR LG STRL (GOWN DISPOSABLE) IMPLANT
GOWN BRE IMP SLV AUR XL STRL (GOWN DISPOSABLE) ×4 IMPLANT
GOWN STRL REIN 2XL LVL4 (GOWN DISPOSABLE) IMPLANT
HEMOSTAT POWDER KIT SURGIFOAM (HEMOSTASIS) ×2 IMPLANT
KIT BASIN OR (CUSTOM PROCEDURE TRAY) ×2 IMPLANT
KIT ROOM TURNOVER OR (KITS) ×2 IMPLANT
NEEDLE HYPO 25X1 1.5 SAFETY (NEEDLE) ×2 IMPLANT
NEEDLE SPNL 20GX3.5 QUINCKE YW (NEEDLE) IMPLANT
NS IRRIG 1000ML POUR BTL (IV SOLUTION) ×2 IMPLANT
PACK LAMINECTOMY NEURO (CUSTOM PROCEDURE TRAY) ×2 IMPLANT
PAD ARMBOARD 7.5X6 YLW CONV (MISCELLANEOUS) ×6 IMPLANT
PAD TELFA 3X4 1S STER (GAUZE/BANDAGES/DRESSINGS) ×2 IMPLANT
PATTIES SURGICAL 1X1 (DISPOSABLE) ×2 IMPLANT
RUBBERBAND STERILE (MISCELLANEOUS) ×4 IMPLANT
SPONGE SURGIFOAM ABS GEL SZ50 (HEMOSTASIS) ×2 IMPLANT
STRIP CLOSURE SKIN 1/2X4 (GAUZE/BANDAGES/DRESSINGS) ×2 IMPLANT
SUT VIC AB 0 CT1 18XCR BRD8 (SUTURE) ×2 IMPLANT
SUT VIC AB 0 CT1 8-18 (SUTURE) ×2
SUT VIC AB 2-0 CP2 18 (SUTURE) ×2 IMPLANT
SUT VIC AB 3-0 SH 8-18 (SUTURE) ×4 IMPLANT
SYR 20ML ECCENTRIC (SYRINGE) ×2 IMPLANT
TOWEL OR 17X24 6PK STRL BLUE (TOWEL DISPOSABLE) ×2 IMPLANT
TOWEL OR 17X26 10 PK STRL BLUE (TOWEL DISPOSABLE) ×2 IMPLANT
WATER STERILE IRR 1000ML POUR (IV SOLUTION) ×2 IMPLANT

## 2011-04-04 NOTE — Op Note (Signed)
04/04/2011  11:32 AM  PATIENT:  [name]  61 y.o. 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 Newell Coral  ANESTHESIA:   General  EBL: 50 ml  Total I/O In: 1550 [I.V.:1550] Out: 100 [Blood:100]  BLOOD ADMINISTERED:none  DRAINS: Medium Hemovac   SPECIMEN:  No Specimen  INDICATION FOR PROCEDURE: This is a gentleman who presented with back and bilateral leg pain especially with ambulation R. I showed severe spinal stenosis L3-4 and moderately severe stenosis at L4-5. He tried medical management for quite some time without relief. Recommended a decompressive laminectomy L3-4 L4-5. Patient understood the risks, benefits, and alternatives and potential outcomes and wished to proceed.  PROCEDURE DETAILS: The patient was taken to the operating room and after induction of adequate generalized endotracheal anesthesia, the patient was rolled into the prone position on the Wilson frame and all pressure points were padded. The lumbar region was cleaned and then prepped with chloraprep and draped in the usual sterile fashion. 5 cc of local anesthesia was injected and then a dorsal midline incision was made and carried down to the lumbo sacral fascia. The fascia was opened and the paraspinous musculature was taken down in a subperiosteal fashion to expose L3-4 and L4-5 bilaterally. Intraoperative x-ray confirmed my level, and then I used a combination of the high-speed drill and the Kerrison punches to perform a laminectomy, medial facetectomy, and foraminotomy at L3-4 and L4-5 bilaterally. The underlying yellow ligament was opened and removed in a piecemeal fashion to expose the underlying dura and exiting nerve roots. I undercut the lateral recess and dissected  down until I was medial to and distal to the pedicle at each level. The nerve root was well decompressed at each level bilaterally. We then gently retracted the nerve root medially with a retractor, coagulated the epidural venous vasculature, and inspected the disc to make sure there was no herniation. I then palpated with a coronary dilator along the nerve root and into the foramen to assure adequate decompression. I felt no more compression of the nerve root. I irrigated with saline solution containing bacitracin. Achieved hemostasis with bipolar cautery, lined the dura with Gelfoam, placed a medium Hemovac drain and then closed the fascia with 0 Vicryl. I closed the subcutaneous tissues with 2-0 Vicryl and the subcuticular tissues with 3-0 Vicryl. The skin was then closed with benzoin and Steri-Strips. The drapes were removed, a sterile dressing was applied. The patient was awakened from general anesthesia and transferred to the recovery room in stable condition. At the end of the procedure all sponge, needle and instrument counts were correct.   PLAN OF CARE: Admit for overnight observation  PATIENT DISPOSITION:  PACU - hemodynamically stable.   Delay start of Pharmacological VTE agent (>24hrs) due to surgical blood loss or risk of bleeding:  yes

## 2011-04-04 NOTE — Preoperative (Signed)
Beta Blockers   Reason not to administer Beta Blockers:Not Applicable 

## 2011-04-04 NOTE — H&P (Signed)
Subjective: Patient is a 61 y.o. male admitted for LL L3-4, L4-5. Onset of symptoms was a few years ago, gradually worsening since that time.  The pain is rated moderate, and is located at the across the lower back and radiates to legs with stiffness and soreness. History of thoracic myelopathy. The pain is described as aching and stiffness and occurs intermittently. The symptoms have been progressive. Symptoms are exacerbated by exercise. MRI or CT showed stenosis L3-4 and L4-5.   Past Medical History  Diagnosis Date  . Hyperlipidemia   . Degenerative disk disease   . Sleep apnea     uses V pap   . Headache   . Spinal stenosis of lumbar region   . Depression   . Arthritis     Past Surgical History  Procedure Date  . Lumbar disc surgery   . Thoracic vertebral decompression   . Appendectomy   . Knee arthroscopy     x 2  . Foot fusion     Right great toe  . Cataract extraction     Left  . Nasal septum surgery   . Eye surgery     Prior to Admission medications   Medication Sig Start Date End Date Taking? Authorizing Provider  aspirin EC 81 MG tablet Take 81 mg by mouth daily.   Yes Historical Provider, MD  atorvastatin (LIPITOR) 40 MG tablet Take 40 mg by mouth daily.  01/15/11  Yes Historical Provider, MD  Calcium Carbonate-Vitamin D (CALTRATE 600+D) 600-400 MG-UNIT per tablet Take 1 tablet by mouth daily.    Yes Historical Provider, MD  PARoxetine (PAXIL) 20 MG tablet Take 20 mg by mouth every morning.     Yes Historical Provider, MD  vitamin B-12 (CYANOCOBALAMIN) 1000 MCG tablet Take 1,000 mcg by mouth daily.     Yes Historical Provider, MD  Multiple Vitamin (MULTIVITAMIN) tablet Take 1 tablet by mouth daily.     Historical Provider, MD  Omega-3 Fatty Acids (FISH OIL) 1000 MG CAPS Take 1,000 capsules by mouth daily.     Historical Provider, MD  Testosterone (ANDROGEL PUMP) 1.25 GM/ACT (1%) GEL Place onto the skin daily. 4 pumps once daily    Historical Provider, MD  zolpidem  (AMBIEN) 10 MG tablet Take 10 mg by mouth At bedtime as needed. For sleep 11/15/10   Historical Provider, MD   Allergies  Allergen Reactions  . Povidone-Iodine     REACTION: topical    History  Substance Use Topics  . Smoking status: Former Smoker -- 8 years    Types: Cigarettes    Quit date: 03/21/1981  . Smokeless tobacco: Never Used  . Alcohol Use: Not on file     daily glass of wine    Family History  Problem Relation Age of Onset  . Heart disease    . Cancer Mother   . Sleep apnea Brother   . Heart attack Brother   . Alcohol abuse Brother   . Heart attack Father   . Alcohol abuse Father      Review of Systems  Positive ROS: neg  All other systems have been reviewed and were otherwise negative with the exception of those mentioned in the HPI and as above.  Objective: Vital signs in last 24 hours: Temp:  [97.4 F (36.3 C)] 97.4 F (36.3 C) (02/20 0646) Pulse Rate:  [48] 48  (02/20 0646) Resp:  [18] 18  (02/20 0646) BP: (128)/(73) 128/73 mmHg (02/20 0646) SpO2:  [98 %] 98 % (  02/20 0646)  General Appearance: Alert, cooperative, no distress, appears stated age Head: Normocephalic, without obvious abnormality, atraumatic Eyes: PERRL, conjunctiva/corneas clear, EOM's intact, fundi benign, both eyes      Ears: Normal TM's and external ear canals, both ears Throat: Lips, mucosa, and tongue normal; teeth and gums normal Neck: Supple, symmetrical, trachea midline, no adenopathy; thyroid: No enlargement/tenderness/nodules; no carotid bruit or JVD Back: Symmetric, no curvature, ROM normal, no CVA tenderness Lungs: Clear to auscultation bilaterally, respirations unlabored Heart: Regular rate and rhythm, S1 and S2 normal, no murmur, rub or gallop Abdomen: Soft, non-tender, bowel sounds active all four quadrants, no masses, no organomegaly Extremities: Extremities normal, atraumatic, no cyanosis or edema Pulses: 2+ and symmetric all extremities Skin: Skin color, texture,  turgor normal, no rashes or lesions  NEUROLOGIC:   Mental status: Alert and oriented x4,  no aphasia, good attention span, fund of knowledge, and memory Motor Exam - grossly normal Sensory Exam - grossly normal Reflexes: increased Coordination - grossly normal Gait - mildly spastic Balance - grossly normal Cranial Nerves: I: smell Not tested  II: visual acuity  OS: nl    OD: nl  II: visual fields Full to confrontation  II: pupils Equal, round, reactive to light  III,VII: ptosis None  III,IV,VI: extraocular muscles  Full ROM  V: mastication Normal  V: facial light touch sensation  Normal  V,VII: corneal reflex  Present  VII: facial muscle function - upper  Normal  VII: facial muscle function - lower Normal  VIII: hearing Not tested  IX: soft palate elevation  Normal  IX,X: gag reflex Present  XI: trapezius strength  5/5  XI: sternocleidomastoid strength 5/5  XI: neck flexion strength  5/5  XII: tongue strength  Normal    Data Review Lab Results  Component Value Date   WBC 5.3 03/22/2011   HGB 15.4 03/22/2011   HCT 43.7 03/22/2011   MCV 91.4 03/22/2011   PLT 164 03/22/2011   Lab Results  Component Value Date   NA 140 03/22/2011   K 4.2 03/22/2011   CL 103 03/22/2011   CO2 28 03/22/2011   BUN 19 03/22/2011   CREATININE 0.84 03/22/2011   GLUCOSE 80 03/22/2011   Lab Results  Component Value Date   INR 1.01 03/22/2011    Assessment/Plan: Patient admitted for LL L3-4 L4-5 for stenosis. Patient has failed conservative therapy.  I explained the condition and procedure to the patient and answered any questions.  Patient wishes to proceed with procedure as planned. Understands risks/ benefits and typical outcomes of procedure.   [name] S 04/04/2011 7:53 AM

## 2011-04-04 NOTE — Discharge Instructions (Signed)
Wound Care Keep incision covered and dry for one week.  If you shower prior to then, cover incision with plastic wrap.  You may remove outer bandage after one week and shower.  Do not put any creams, lotions, or ointments on incision. Leave steri-strips on neck.  They will fall off by themselves. Activity Walk each and every day, increasing distance each day. No lifting greater than 5 lbs.  Avoid bending, arching, or twisting. No driving for 2 weeks; may ride as a passenger locally. Diet Resume your normal diet.  Return to Work Will be discussed at you follow up appointment. Call Your Doctor If Any of These Occur Redness, drainage, or swelling at the wound.  Temperature greater than 101 degrees. Severe pain not relieved by pain medication. Incision starts to come apart. Follow Up Appt Call today for appointment in 1-2 weeks ([phone]) or for problems.  If you have any hardware placed in your spine, you will need an x-ray before your appointment. 

## 2011-04-04 NOTE — Anesthesia Preprocedure Evaluation (Signed)
Anesthesia Evaluation  Patient identified by MRN, date of birth, ID band Patient awake    Reviewed: Allergy & Precautions, H&P , NPO status , Patient's Chart, lab work & pertinent test results  Airway Mallampati: II      Dental  (+) Teeth Intact   Pulmonary  clear to auscultation        Cardiovascular neg cardio ROS Regular Normal    Neuro/Psych    GI/Hepatic negative GI ROS,   Endo/Other    Renal/GU      Musculoskeletal   Abdominal   Peds  Hematology   Anesthesia Other Findings   Reproductive/Obstetrics                           Anesthesia Physical Anesthesia Plan  ASA: II  Anesthesia Plan: General   Post-op Pain Management:    Induction: Intravenous  Airway Management Planned: Oral ETT  Additional Equipment:   Intra-op Plan:   Post-operative Plan: Extubation in OR  Informed Consent: I have reviewed the patients History and Physical, chart, labs and discussed the procedure including the risks, benefits and alternatives for the proposed anesthesia with the patient or authorized representative who has indicated his/her understanding and acceptance.     Plan Discussed with:   Anesthesia Plan Comments:         Anesthesia Quick Evaluation

## 2011-04-04 NOTE — Anesthesia Postprocedure Evaluation (Signed)
  Anesthesia Post-op Note  Patient: [name]  Procedure(s) Performed: Procedure(s) (LRB): LUMBAR LAMINECTOMY/DECOMPRESSION MICRODISCECTOMY 2 LEVELS (N/A)  Patient Location: PACU  Anesthesia Type: General  Level of Consciousness: awake, alert  and oriented  Airway and Oxygen Therapy: Patient Spontanous Breathing and Patient connected to nasal cannula oxygen  Post-op Pain: mild  Post-op Assessment: Post-op Vital signs reviewed and Patient's Cardiovascular Status Stable  Post-op Vital Signs: stable  Complications: No apparent anesthesia complications

## 2011-04-04 NOTE — Transfer of Care (Signed)
Immediate Anesthesia Transfer of Care Note  Patient: [name]  Procedure(s) Performed: Procedure(s) (LRB): LUMBAR LAMINECTOMY/DECOMPRESSION MICRODISCECTOMY 2 LEVELS (N/A)  Patient Location: PACU  Anesthesia Type: General  Level of Consciousness: awake, alert , oriented, sedated, patient cooperative and responds to stimulation  Airway & Oxygen Therapy: Patient Spontanous Breathing and Patient connected to nasal cannula oxygen  Post-op Assessment: Report given to PACU RN, Post -op Vital signs reviewed and stable, Patient moving all extremities and Patient moving all extremities X 4  Post vital signs: Reviewed and stable  Complications: No apparent anesthesia complications

## 2011-04-04 NOTE — Anesthesia Procedure Notes (Signed)
Procedure Name: Intubation Date/Time: 04/04/2011 8:51 AM Performed by: Wray Kearns A Pre-anesthesia Checklist: Patient identified, Timeout performed, Emergency Drugs available, Suction available and Patient being monitored Patient Re-evaluated:Patient Re-evaluated prior to inductionOxygen Delivery Method: Circle System Utilized Preoxygenation: Pre-oxygenation with 100% oxygen Intubation Type: IV induction Ventilation: Mask ventilation without difficulty and Oral airway inserted - appropriate to patient size Laryngoscope Size: Mac and 4 Grade View: Grade I Tube type: Oral Tube size: 8.5 mm Number of attempts: 1 Airway Equipment and Method: stylet Placement Confirmation: ETT inserted through vocal cords under direct vision,  breath sounds checked- equal and bilateral,  positive ETCO2 and CO2 detector Secured at: 23 cm Tube secured with: Tape Dental Injury: Teeth and Oropharynx as per pre-operative assessment

## 2011-04-05 MED ORDER — BISACODYL 10 MG RE SUPP: 10.0000 mg | Freq: Every day | RECTAL | Status: DC | PRN

## 2011-04-05 MED ORDER — TAMSULOSIN HCL 0.4 MG PO CAPS
0.4000 mg | ORAL_CAPSULE | Freq: Every day | ORAL | Status: DC
  Administered 2011-04-05 – 2011-04-06 (×3): 0.4 mg via ORAL
  Filled 2011-04-05 (×2): qty 1

## 2011-04-05 MED ORDER — NAPROXEN 250 MG PO TABS
250.0000 mg | ORAL_TABLET | Freq: Four times a day (QID) | ORAL | Status: DC | PRN
  Administered 2011-04-05 – 2011-04-06 (×3): 250 mg via ORAL
  Filled 2011-04-05 (×3): qty 1

## 2011-04-05 MED ORDER — DOCUSATE SODIUM 100 MG PO CAPS
100.0000 mg | ORAL_CAPSULE | Freq: Every day | ORAL | Status: DC
  Administered 2011-04-05 – 2011-04-06 (×3): 100 mg via ORAL
  Filled 2011-04-05 (×2): qty 1

## 2011-04-05 NOTE — Progress Notes (Signed)
Pt. Up to bathroom and voiding 2 x for total of 325cc by 0610 but bladder scan at that time showing 679cc of urine still in bladder and pt. Unable to void any more.  In & out cath done at 0630 with 625cc of straw colored urine received.  Pt. Tolerated well.   CHRIS Gaynell Eggleton RN

## 2011-04-05 NOTE — Progress Notes (Signed)
Patient ID: [name], male   DOB: 12/08/1950, 61 y.o.   MRN: [id number] Subjective: Patient reports minimal back pain, no leg pain, no numbness tingling or weakness. Ambulating well, but having trouble voiding.  Objective: Vital signs in last 24 hours: Temp:  [97.4 F (36.3 C)-98.5 F (36.9 C)] 98.1 F (36.7 C) (02/21 0800) Pulse Rate:  [54-88] 58  (02/21 0800) Resp:  [11-23] 16  (02/21 0800) BP: (89-145)/(43-90) 100/55 mmHg (02/21 0800) SpO2:  [94 %-97 %] 95 % (02/21 0800)  Intake/Output from previous day: 02/20 0701 - 02/21 0700 In: 3290 [P.O.:1440; I.V.:1850] Out: 3584 [Urine:3179; Drains:305; Blood:100] Intake/Output this shift: Total I/O In: 480 [P.O.:480] Out: -   Neurologic: Grossly normal Stable mild myelopathy Incision clean dry and intact  Lab Results: Lab Results  Component Value Date   WBC 5.3 03/22/2011   HGB 15.4 03/22/2011   HCT 43.7 03/22/2011   MCV 91.4 03/22/2011   PLT 164 03/22/2011   Lab Results  Component Value Date   INR 1.01 03/22/2011   BMET Lab Results  Component Value Date   NA 140 03/22/2011   K 4.2 03/22/2011   CL 103 03/22/2011   CO2 28 03/22/2011   GLUCOSE 80 03/22/2011   BUN 19 03/22/2011   CREATININE 0.84 03/22/2011   CALCIUM 10.0 03/22/2011    Studies/Results: Dg Lumbar Spine 1 View  04/04/2011  *RADIOLOGY REPORT*  Clinical Data: L3-L5 laminectomy  LUMBAR SPINE - 1 VIEW  Comparison: None.  Findings: The single view shows tissue spreaders posteriorly with a probe directed at the L2-3 disc level.  Tissue spreaders are directed at the L3-4 disc level.  IMPRESSION: Instruments posterior to L3, described above.  Original Report Authenticated By: Thomasenia Sales, M.D.    Assessment/Plan: Doing well except for urinary retention. Given Flomax, continue in out cath as needed.   LOS: 1 day    Deaja Rizo S 04/05/2011, 10:05 AM

## 2011-04-05 NOTE — Progress Notes (Signed)
PT. HAD IN & OUT CATH DONE AT 1840 BY THE DAY SHIFT STAFF.  PT. UP TO BATHROOM AT 2400 AND TRIED TO VOID AND WAS ONLY ABLE TO VOID 150CC AND STILL FELT NEED TO VOID.  BLADDER SCAN SHOWED 808CC.  IN & OUT DONE AT 0030 WITH 900CC OF YELLOW URINE RECEIVED.  PT. TOLERATED WELL.                   CHRIS Nyx Keady RN

## 2011-04-05 NOTE — Progress Notes (Signed)
At 11am to void 120cc,  post void residual 1115 for 472cc, pt refuses cath, to void 125cc at 1200, bladder scan for 308cc., pt states "want to try to void", at 1pm to void 175cc, post void residual at 519cc,   to void 200cc at 1430, bladder scan at 1450 556    I/O cath for 500cc at 1500, instructed pt that will I/O if >400   Q6hr if unable to void with voiced understanding

## 2011-04-05 NOTE — Progress Notes (Signed)
Utilization review completed. Damoni Causby, RN, BSN. 04/05/11 

## 2011-04-06 MED ORDER — TAMSULOSIN HCL 0.4 MG PO CAPS: 0.4000 mg | ORAL_CAPSULE | Freq: Every day | ORAL | Status: DC

## 2011-04-06 NOTE — Discharge Summary (Signed)
Physician Discharge Summary  Patient ID: [name] MRN: [id number] DOB/AGE: 1950-08-24 61 y.o.  Admit date: 04/04/2011 Discharge date: 04/06/2011  Admission Diagnoses: lumbar stenosis    Discharge Diagnoses: same   Discharged Condition: good  Hospital Course: The patient was admitted on 04/04/2011 and taken to the operating room where the patient underwent DLL L3-4, l4-5. The patient tolerated the procedure well and was taken to the recovery room and then to the floor in stable condition. The hospital course was routine except for urinary retention requiring I/O cath prn . There were no complications. The wound remained clean dry and intact. The patient remained afebrile with stable vital signs, and tolerated a regular diet. The patient continued to increase activities, and pain was well controlled with oral pain medications.   Consults: None  Significant Diagnostic Studies:  Results for orders placed during the hospital encounter of 03/22/11  APTT      Component Value Range   aPTT 27  24 - 37 (seconds)  BASIC METABOLIC PANEL      Component Value Range   Sodium 140  135 - 145 (mEq/L)   Potassium 4.2  3.5 - 5.1 (mEq/L)   Chloride 103  96 - 112 (mEq/L)   CO2 28  19 - 32 (mEq/L)   Glucose, Bld 80  70 - 99 (mg/dL)   BUN 19  6 - 23 (mg/dL)   Creatinine, Ser 8.29  0.50 - 1.35 (mg/dL)   Calcium 56.2  8.4 - 10.5 (mg/dL)   GFR calc non Af Amer >90  >90 (mL/min)   GFR calc Af Amer >90  >90 (mL/min)  CBC      Component Value Range   WBC 5.3  4.0 - 10.5 (K/uL)   RBC 4.78  4.22 - 5.81 (MIL/uL)   Hemoglobin 15.4  13.0 - 17.0 (g/dL)   HCT 13.0  86.5 - 78.4 (%)   MCV 91.4  78.0 - 100.0 (fL)   MCH 32.2  26.0 - 34.0 (pg)   MCHC 35.2  30.0 - 36.0 (g/dL)   RDW 69.6  29.5 - 28.4 (%)   Platelets 164  150 - 400 (K/uL)  DIFFERENTIAL      Component Value Range   Neutrophils Relative 60  43 - 77 (%)   Neutro Abs 3.2  1.7 - 7.7 (K/uL)   Lymphocytes Relative 28  12 - 46 (%)   Lymphs Abs  1.5  0.7 - 4.0 (K/uL)   Monocytes Relative 9  3 - 12 (%)   Monocytes Absolute 0.5  0.1 - 1.0 (K/uL)   Eosinophils Relative 2  0 - 5 (%)   Eosinophils Absolute 0.1  0.0 - 0.7 (K/uL)   Basophils Relative 1  0 - 1 (%)   Basophils Absolute 0.0  0.0 - 0.1 (K/uL)  PROTIME-INR      Component Value Range   Prothrombin Time 13.5  11.6 - 15.2 (seconds)   INR 1.01  0.00 - 1.49   SURGICAL PCR SCREEN      Component Value Range   MRSA, PCR NEGATIVE  NEGATIVE    Staphylococcus aureus NEGATIVE  NEGATIVE     Chest 2 View  03/22/2011  *RADIOLOGY REPORT*  Clinical Data: Preoperative respiratory films.  CHEST - 2 VIEW  Comparison: Plain films of the chest 08/23/2005.  Findings: The lungs are clear.  No pneumothorax or pleural effusion. Heart size is normal.  Spinal stabilization hardware lower thoracic spine noted.  IMPRESSION: No acute disease.  Original Report  Authenticated By: Bernadene Bell. Maricela Curet, M.D.   Dg Lumbar Spine 1 View  04/04/2011  *RADIOLOGY REPORT*  Clinical Data: L3-L5 laminectomy  LUMBAR SPINE - 1 VIEW  Comparison: None.  Findings: The single view shows tissue spreaders posteriorly with a probe directed at the L2-3 disc level.  Tissue spreaders are directed at the L3-4 disc level.  IMPRESSION: Instruments posterior to L3, described above.  Original Report Authenticated By: Thomasenia Sales, M.D.    Antibiotics:  Anti-infectives     Start     Dose/Rate Route Frequency Ordered Stop   04/04/11 1630   ceFAZolin (ANCEF) IVPB 1 g/50 mL premix        1 g 100 mL/hr over 30 Minutes Intravenous Every 8 hours 04/04/11 1242 04/05/11 0022   04/04/11 0923   bacitracin 50,000 Units in sodium chloride irrigation 0.9 % 500 mL irrigation  Status:  Discontinued          As needed 04/04/11 0923 04/04/11 1134   04/04/11 0818   bacitracin 46962 UNITS injection     Comments: WALSH, AMY: cabinet override         04/04/11 0818 04/04/11 2029   04/03/11 1545   ceFAZolin (ANCEF) IVPB 2 g/50 mL premix        2  g 100 mL/hr over 30 Minutes Intravenous  Once 04/03/11 1531 04/04/11 0830          Discharge Exam: Blood pressure 110/62, pulse 50, temperature 98.5 F (36.9 C), temperature source Oral, resp. rate 16, SpO2 97.00%. Neurologic: Grossly normal with stable mild myelopathy, wound CDI, ambulating well with minimal pain  Discharge Medications:   Medication List  As of 04/06/2011 10:54 AM   TAKE these medications         ANDROGEL PUMP 1.25 GM/ACT (1%) Gel   Generic drug: Testosterone   Place onto the skin daily. 4 pumps once daily      aspirin EC 81 MG tablet   Take 81 mg by mouth daily.      atorvastatin 40 MG tablet   Commonly known as: LIPITOR   Take 40 mg by mouth daily.      CALTRATE 600+D 600-400 MG-UNIT per tablet   Generic drug: Calcium Carbonate-Vitamin D   Take 1 tablet by mouth daily.      Fish Oil 1000 MG Caps   Take 1,000 capsules by mouth daily.      multivitamin tablet   Take 1 tablet by mouth daily.      PARoxetine 20 MG tablet   Commonly known as: PAXIL   Take 20 mg by mouth every morning.      Tamsulosin HCl 0.4 MG Caps   Commonly known as: FLOMAX   Take 1 capsule (0.4 mg total) by mouth daily.      vitamin B-12 1000 MCG tablet   Commonly known as: CYANOCOBALAMIN   Take 1,000 mcg by mouth daily.      zolpidem 10 MG tablet   Commonly known as: AMBIEN   Take 10 mg by mouth At bedtime as needed. For sleep            Disposition: home   Final Dx: DLL L3-4, L4-5  Discharge Orders    Future Appointments: Provider: Department: Dept Phone: Center:   02/21/2012 9:00 AM Waymon Budge, MD [facility] [phone] None     Future Orders Please Complete By Expires   Diet - low sodium heart healthy      Increase activity  slowly      Driving Restrictions      Comments:   1 week   Lifting restrictions      Comments:   Less than 10 lbs   No wound care      Call MD for:  temperature >100.4      Call MD for:  persistant nausea and vomiting       Call MD for:  severe uncontrolled pain      Call MD for:  redness, tenderness, or signs of infection (pain, swelling, redness, odor or green/yellow discharge around incision site)      Call MD for:  difficulty breathing, headache or visual disturbances         Follow-up Information    Follow up with Dareth Andrew S, MD in 2 weeks.   Contact information:   [address] [place] [address] [phone]           Signed: Tia Alert 04/06/2011, 10:54 AM

## 2011-04-08 ENCOUNTER — Encounter (HOSPITAL_COMMUNITY): Payer: Self-pay | Admitting: Neurological Surgery

## 2011-04-27 ENCOUNTER — Encounter (HOSPITAL_COMMUNITY): Payer: Self-pay | Admitting: Pharmacy Technician

## 2011-05-01 ENCOUNTER — Encounter (HOSPITAL_COMMUNITY)
Admission: RE | Admit: 2011-05-01 | Discharge: 2011-05-01 | Disposition: A | Discharge status: Home / Self Care | Payer: BC Managed Care – PPO | Source: Ambulatory Visit | Attending: Neurological Surgery | Admitting: Neurological Surgery

## 2011-05-01 ENCOUNTER — Other Ambulatory Visit (HOSPITAL_COMMUNITY): Payer: BC Managed Care – PPO

## 2011-05-01 ENCOUNTER — Encounter (HOSPITAL_COMMUNITY): Payer: Self-pay

## 2011-05-01 LAB — SURGICAL PCR SCREEN: Staphylococcus aureus: NEGATIVE

## 2011-05-01 LAB — DIFFERENTIAL
Basophils Absolute: 0 10*3/uL (ref 0.0–0.1)
Lymphocytes Relative: 29 % (ref 12–46)
Monocytes Absolute: 0.5 10*3/uL (ref 0.1–1.0)
Neutro Abs: 3 10*3/uL (ref 1.7–7.7)
Neutrophils Relative %: 57 % (ref 43–77)

## 2011-05-01 LAB — APTT: aPTT: 28 seconds (ref 24–37)

## 2011-05-01 LAB — COMPREHENSIVE METABOLIC PANEL
ALT: 29 U/L (ref 0–53)
AST: 23 U/L (ref 0–37)
Albumin: 4.1 g/dL (ref 3.5–5.2)
CO2: 25 mEq/L (ref 19–32)
Calcium: 10.1 mg/dL (ref 8.4–10.5)
GFR calc non Af Amer: >90 mL/min (ref >90)
Sodium: 137 mEq/L (ref 135–145)
Total Protein: 7.4 g/dL (ref 6.0–8.3)

## 2011-05-01 LAB — PROTIME-INR: INR: 1.01 (ref 0.00–1.49)

## 2011-05-01 LAB — CBC
HCT: 41.5 % (ref 39.0–52.0)
Hemoglobin: 14.8 g/dL (ref 13.0–17.0)
RDW: 12.7 % (ref 11.5–15.5)
WBC: 5.3 10*3/uL (ref 4.0–10.5)

## 2011-05-01 NOTE — Pre-Procedure Instructions (Signed)
[address] JERMICHAEL BELMARES  05/01/2011   Your procedure is scheduled on:  May 09, 2011  Report to [facility] at 0630 AM.  Call this number if you have problems the morning of surgery: [phone]   Remember:   Do not eat food:After Midnight.  May have clear liquids: up to 4 Hours before arrival.  Clear liquids include soda, tea, black coffee, apple or grape juice, broth.  Take these medicines the morning of surgery with A SIP OF WATER: Cymbalta   STOP Multiple Vitamins, Vitamin B12, Calcium Carbonate with Vitamin D 05/03/11  Do not wear jewelry, make-up or nail polish.  Do not wear lotions, powders, or perfumes. You may wear deodorant.  Do not shave 48 hours prior to surgery.  Do not bring valuables to the hospital.  Contacts, dentures or bridgework may not be worn into surgery.  Leave suitcase in the car. After surgery it may be brought to your room.  For patients admitted to the hospital, checkout time is 11:00 AM the day of discharge.   Patients discharged the day of surgery will not be allowed to drive home.  Special Instructions: CHG Shower Use Special Wash: 1/2 bottle night before surgery and 1/2 bottle morning of surgery.   Please read over the following fact sheets that you were given: Pain Booklet, Coughing and Deep Breathing, MRSA Information and Surgical Site Infection Prevention

## 2011-05-08 MED ORDER — CEFAZOLIN SODIUM-DEXTROSE 2-3 GM-% IV SOLR
2.0000 g | INTRAVENOUS | Status: AC
  Administered 2011-05-09: 2 g via INTRAVENOUS
  Filled 2011-05-08: qty 50

## 2011-05-08 MED ORDER — DEXAMETHASONE SODIUM PHOSPHATE 10 MG/ML IJ SOLN
10.0000 mg | Freq: Once | INTRAMUSCULAR | Status: AC
  Administered 2011-05-09: 10 mg via INTRAVENOUS
  Filled 2011-05-08: qty 1

## 2011-05-09 ENCOUNTER — Ambulatory Visit (HOSPITAL_COMMUNITY): Payer: BC Managed Care – PPO | Admitting: Anesthesiology

## 2011-05-09 ENCOUNTER — Encounter (HOSPITAL_COMMUNITY): Payer: Self-pay | Admitting: Anesthesiology

## 2011-05-09 ENCOUNTER — Encounter (HOSPITAL_COMMUNITY): Payer: Self-pay | Admitting: *Deleted

## 2011-05-09 ENCOUNTER — Ambulatory Visit (HOSPITAL_COMMUNITY): Payer: BC Managed Care – PPO

## 2011-05-09 ENCOUNTER — Encounter (HOSPITAL_COMMUNITY): Payer: Self-pay | Admitting: Neurological Surgery

## 2011-05-09 ENCOUNTER — Inpatient Hospital Stay (HOSPITAL_COMMUNITY)
Admission: RE | Admit: 2011-05-09 | Discharge: 2011-05-11 | DRG: 865 | Disposition: A | Discharge status: Home / Self Care | Payer: BC Managed Care – PPO | Source: Ambulatory Visit | Attending: Neurological Surgery | Admitting: Neurological Surgery

## 2011-05-09 ENCOUNTER — Encounter (HOSPITAL_COMMUNITY)
Admission: RE | Disposition: A | Discharge status: Home / Self Care | Payer: Self-pay | Source: Ambulatory Visit | Attending: Neurological Surgery

## 2011-05-09 DIAGNOSIS — M4802 Spinal stenosis, cervical region: Secondary | ICD-10-CM

## 2011-05-09 DIAGNOSIS — IMO0002 Reserved for concepts with insufficient information to code with codable children: Secondary | ICD-10-CM | POA: Diagnosis present

## 2011-05-09 DIAGNOSIS — F3289 Other specified depressive episodes: Secondary | ICD-10-CM | POA: Diagnosis present

## 2011-05-09 DIAGNOSIS — F329 Major depressive disorder, single episode, unspecified: Secondary | ICD-10-CM | POA: Diagnosis present

## 2011-05-09 DIAGNOSIS — M129 Arthropathy, unspecified: Secondary | ICD-10-CM | POA: Diagnosis present

## 2011-05-09 DIAGNOSIS — M47812 Spondylosis without myelopathy or radiculopathy, cervical region: Principal | ICD-10-CM | POA: Diagnosis present

## 2011-05-09 DIAGNOSIS — E785 Hyperlipidemia, unspecified: Secondary | ICD-10-CM | POA: Diagnosis present

## 2011-05-09 HISTORY — PX: OTHER SURGICAL HISTORY: SHX169

## 2011-05-09 SURGERY — ANTERIOR CERVICAL DECOMPRESSION/DISCECTOMY FUSION 4 LEVELS
Anesthesia: General | Site: Spine Cervical | Wound class: Clean

## 2011-05-09 MED ORDER — ACETAMINOPHEN 650 MG RE SUPP: 650.0000 mg | RECTAL | Status: DC | PRN

## 2011-05-09 MED ORDER — PROPOFOL 10 MG/ML IV EMUL
INTRAVENOUS | Status: DC | PRN
  Administered 2011-05-09: 200 mg via INTRAVENOUS

## 2011-05-09 MED ORDER — BETHANECHOL CHLORIDE 25 MG PO TABS
25.0000 mg | ORAL_TABLET | Freq: Two times a day (BID) | ORAL | Status: DC
  Administered 2011-05-09 – 2011-05-10 (×3): 25 mg via ORAL
  Filled 2011-05-09 (×7): qty 1

## 2011-05-09 MED ORDER — ONDANSETRON HCL 4 MG/2ML IJ SOLN: 4.0000 mg | INTRAMUSCULAR | Status: DC | PRN

## 2011-05-09 MED ORDER — METHOCARBAMOL 100 MG/ML IJ SOLN
500.0000 mg | Freq: Four times a day (QID) | INTRAMUSCULAR | Status: DC | PRN
  Filled 2011-05-09: qty 5

## 2011-05-09 MED ORDER — POTASSIUM CHLORIDE IN NACL 20-0.9 MEQ/L-% IV SOLN
INTRAVENOUS | Status: DC
  Administered 2011-05-09: 15:00:00 via INTRAVENOUS
  Filled 2011-05-09 (×5): qty 1000

## 2011-05-09 MED ORDER — LACTATED RINGERS IV SOLN
INTRAVENOUS | Status: DC | PRN
  Administered 2011-05-09 (×3): via INTRAVENOUS

## 2011-05-09 MED ORDER — VECURONIUM BROMIDE 10 MG IV SOLR
INTRAVENOUS | Status: DC | PRN
  Administered 2011-05-09: 2 mg via INTRAVENOUS
  Administered 2011-05-09 (×2): 3 mg via INTRAVENOUS

## 2011-05-09 MED ORDER — THROMBIN 5000 UNITS EX SOLR
OROMUCOSAL | Status: DC | PRN
  Administered 2011-05-09: 10:00:00 via TOPICAL

## 2011-05-09 MED ORDER — DEXAMETHASONE SODIUM PHOSPHATE 4 MG/ML IJ SOLN
4.0000 mg | Freq: Four times a day (QID) | INTRAMUSCULAR | Status: DC
  Administered 2011-05-09: 4 mg via INTRAVENOUS
  Filled 2011-05-09 (×9): qty 1

## 2011-05-09 MED ORDER — ADULT MULTIVITAMIN W/MINERALS CH
1.0000 | ORAL_TABLET | Freq: Every day | ORAL | Status: DC
  Administered 2011-05-10: 1 via ORAL
  Filled 2011-05-09 (×3): qty 1

## 2011-05-09 MED ORDER — METHOCARBAMOL 500 MG PO TABS
500.0000 mg | ORAL_TABLET | Freq: Four times a day (QID) | ORAL | Status: DC | PRN
  Administered 2011-05-10 – 2011-05-11 (×3): 500 mg via ORAL
  Filled 2011-05-09 (×3): qty 1

## 2011-05-09 MED ORDER — OXYCODONE-ACETAMINOPHEN 5-325 MG PO TABS
1.0000 | ORAL_TABLET | ORAL | Status: DC | PRN
  Administered 2011-05-09 – 2011-05-11 (×8): 2 via ORAL
  Filled 2011-05-09 (×8): qty 2

## 2011-05-09 MED ORDER — DULOXETINE HCL 60 MG PO CPEP
60.0000 mg | ORAL_CAPSULE | Freq: Every day | ORAL | Status: DC
  Administered 2011-05-10: 60 mg via ORAL
  Filled 2011-05-09 (×3): qty 1

## 2011-05-09 MED ORDER — HYDROMORPHONE HCL PF 1 MG/ML IJ SOLN
0.5000 mg | INTRAMUSCULAR | Status: DC | PRN
  Administered 2011-05-09: 1 mg via INTRAVENOUS
  Filled 2011-05-09: qty 1

## 2011-05-09 MED ORDER — LABETALOL HCL 5 MG/ML IV SOLN
INTRAVENOUS | Status: DC | PRN
  Administered 2011-05-09 (×2): 5 mg via INTRAVENOUS

## 2011-05-09 MED ORDER — GLYCOPYRROLATE 0.2 MG/ML IJ SOLN
INTRAMUSCULAR | Status: DC | PRN
  Administered 2011-05-09: .6 mg via INTRAVENOUS

## 2011-05-09 MED ORDER — BACITRACIN 50000 UNITS IM SOLR
INTRAMUSCULAR | Status: AC
  Filled 2011-05-09: qty 1

## 2011-05-09 MED ORDER — PHENOL 1.4 % MT LIQD: 1.0000 | OROMUCOSAL | Status: DC | PRN

## 2011-05-09 MED ORDER — SODIUM CHLORIDE 0.9 % IJ SOLN: 3.0000 mL | INTRAMUSCULAR | Status: DC | PRN

## 2011-05-09 MED ORDER — FENTANYL CITRATE 0.05 MG/ML IJ SOLN
INTRAMUSCULAR | Status: DC | PRN
  Administered 2011-05-09 (×6): 50 ug via INTRAVENOUS
  Administered 2011-05-09: 100 ug via INTRAVENOUS
  Administered 2011-05-09 (×2): 50 ug via INTRAVENOUS

## 2011-05-09 MED ORDER — ALBUMIN HUMAN 5 % IV SOLN
INTRAVENOUS | Status: DC | PRN
  Administered 2011-05-09: 10:00:00 via INTRAVENOUS

## 2011-05-09 MED ORDER — SENNA 8.6 MG PO TABS
1.0000 | ORAL_TABLET | Freq: Two times a day (BID) | ORAL | Status: DC
  Administered 2011-05-09 – 2011-05-10 (×3): 8.6 mg via ORAL
  Filled 2011-05-09 (×6): qty 1

## 2011-05-09 MED ORDER — ROCURONIUM BROMIDE 100 MG/10ML IV SOLN
INTRAVENOUS | Status: DC | PRN
  Administered 2011-05-09: 50 mg via INTRAVENOUS

## 2011-05-09 MED ORDER — SODIUM CHLORIDE 0.9 % IV SOLN
INTRAVENOUS | Status: AC
  Filled 2011-05-09: qty 500

## 2011-05-09 MED ORDER — DROPERIDOL 2.5 MG/ML IJ SOLN
INTRAMUSCULAR | Status: DC | PRN
  Administered 2011-05-09: 0.625 mg via INTRAVENOUS

## 2011-05-09 MED ORDER — ACETAMINOPHEN 325 MG PO TABS
650.0000 mg | ORAL_TABLET | ORAL | Status: DC | PRN
  Filled 2011-05-09: qty 2

## 2011-05-09 MED ORDER — ONDANSETRON HCL 4 MG/2ML IJ SOLN
INTRAMUSCULAR | Status: DC | PRN
  Administered 2011-05-09: 4 mg via INTRAVENOUS

## 2011-05-09 MED ORDER — LIDOCAINE HCL (CARDIAC) 20 MG/ML IV SOLN
INTRAVENOUS | Status: DC | PRN
  Administered 2011-05-09: 50 mg via INTRAVENOUS

## 2011-05-09 MED ORDER — NEOSTIGMINE METHYLSULFATE 1 MG/ML IJ SOLN
INTRAMUSCULAR | Status: DC | PRN
  Administered 2011-05-09: 4 mg via INTRAVENOUS

## 2011-05-09 MED ORDER — SODIUM CHLORIDE 0.9 % IV SOLN: 250.0000 mL | INTRAVENOUS | Status: DC

## 2011-05-09 MED ORDER — LACTATED RINGERS IV SOLN: INTRAVENOUS | Status: DC

## 2011-05-09 MED ORDER — SODIUM CHLORIDE 0.9 % IR SOLN
Status: DC | PRN
  Administered 2011-05-09: 10:00:00

## 2011-05-09 MED ORDER — SILODOSIN 8 MG PO CAPS
8.0000 mg | ORAL_CAPSULE | Freq: Every evening | ORAL | Status: DC
  Administered 2011-05-09 – 2011-05-10 (×2): 8 mg via ORAL
  Filled 2011-05-09 (×3): qty 1

## 2011-05-09 MED ORDER — 0.9 % SODIUM CHLORIDE (POUR BTL) OPTIME
TOPICAL | Status: DC | PRN
  Administered 2011-05-09: 1000 mL

## 2011-05-09 MED ORDER — MENTHOL 3 MG MT LOZG
1.0000 | LOZENGE | OROMUCOSAL | Status: DC | PRN
  Administered 2011-05-09: 3 mg via ORAL
  Filled 2011-05-09 (×2): qty 9

## 2011-05-09 MED ORDER — MORPHINE SULFATE 2 MG/ML IJ SOLN: 0.0500 mg/kg | INTRAMUSCULAR | Status: DC | PRN

## 2011-05-09 MED ORDER — MIDAZOLAM HCL 5 MG/5ML IJ SOLN
INTRAMUSCULAR | Status: DC | PRN
  Administered 2011-05-09: 2 mg via INTRAVENOUS

## 2011-05-09 MED ORDER — CEFAZOLIN SODIUM 1-5 GM-% IV SOLN
1.0000 g | Freq: Three times a day (TID) | INTRAVENOUS | Status: AC
  Administered 2011-05-09 (×2): 1 g via INTRAVENOUS
  Filled 2011-05-09 (×2): qty 50

## 2011-05-09 MED ORDER — THROMBIN 20000 UNITS EX KIT
PACK | CUTANEOUS | Status: DC | PRN
  Administered 2011-05-09: 10:00:00 via TOPICAL

## 2011-05-09 MED ORDER — DEXAMETHASONE 4 MG PO TABS
4.0000 mg | ORAL_TABLET | Freq: Four times a day (QID) | ORAL | Status: DC
  Administered 2011-05-10 – 2011-05-11 (×6): 4 mg via ORAL
  Filled 2011-05-09 (×11): qty 1

## 2011-05-09 MED ORDER — CALCIUM CARBONATE-VITAMIN D 500-200 MG-UNIT PO TABS
1.0000 | ORAL_TABLET | Freq: Every day | ORAL | Status: DC
  Administered 2011-05-10: 1 via ORAL
  Filled 2011-05-09 (×3): qty 1

## 2011-05-09 MED ORDER — ZOLPIDEM TARTRATE 10 MG PO TABS
10.0000 mg | ORAL_TABLET | Freq: Every evening | ORAL | Status: DC | PRN
  Administered 2011-05-09 – 2011-05-10 (×2): 10 mg via ORAL
  Filled 2011-05-09 (×2): qty 1

## 2011-05-09 MED ORDER — HYDROMORPHONE HCL PF 1 MG/ML IJ SOLN: 0.2500 mg | INTRAMUSCULAR | Status: DC | PRN

## 2011-05-09 MED ORDER — SODIUM CHLORIDE 0.9 % IJ SOLN
3.0000 mL | Freq: Two times a day (BID) | INTRAMUSCULAR | Status: DC
  Administered 2011-05-09: 3 mL via INTRAVENOUS

## 2011-05-09 MED ORDER — BUPIVACAINE HCL (PF) 0.25 % IJ SOLN
INTRAMUSCULAR | Status: DC | PRN
  Administered 2011-05-09: 8 mL

## 2011-05-09 SURGICAL SUPPLY — 59 items
86mm 4level plate ×2 IMPLANT
BAG DECANTER FOR FLEXI CONT (MISCELLANEOUS) ×2 IMPLANT
BENZOIN TINCTURE PRP APPL 2/3 (GAUZE/BANDAGES/DRESSINGS) ×2 IMPLANT
BLADE ULTRA TIP 2M (BLADE) ×2 IMPLANT
BUR MATCHSTICK NEURO 3.0 LAGG (BURR) ×2 IMPLANT
CANISTER SUCTION 2500CC (MISCELLANEOUS) IMPLANT
CHLORAPREP W/TINT 26ML (MISCELLANEOUS) ×2 IMPLANT
CLOTH BEACON ORANGE TIMEOUT ST (SAFETY) ×2 IMPLANT
CONT SPEC 4OZ CLIKSEAL STRL BL (MISCELLANEOUS) ×2 IMPLANT
DRAIN SNY WOU 7FLT (WOUND CARE) ×2 IMPLANT
DRAPE C-ARM 42X72 X-RAY (DRAPES) ×4 IMPLANT
DRAPE LAPAROTOMY 100X72 PEDS (DRAPES) ×2 IMPLANT
DRAPE MICROSCOPE LEICA (MISCELLANEOUS) ×2 IMPLANT
DRAPE MICROSCOPE ZEISS OPMI (DRAPES) IMPLANT
DRAPE POUCH INSTRU U-SHP 10X18 (DRAPES) ×2 IMPLANT
DRESSING TELFA 8X3 (GAUZE/BANDAGES/DRESSINGS) ×2 IMPLANT
DRILL BIT HELIX 13MM (BIT) ×2 IMPLANT
DRSG OPSITE 4X5.5 SM (GAUZE/BANDAGES/DRESSINGS) ×2 IMPLANT
DURAPREP 6ML APPLICATOR 50/CS (WOUND CARE) IMPLANT
ELECT COATED BLADE 2.86 ST (ELECTRODE) ×2 IMPLANT
ELECT REM PT RETURN 9FT ADLT (ELECTROSURGICAL) ×2
ELECTRODE REM PT RTRN 9FT ADLT (ELECTROSURGICAL) ×1 IMPLANT
GAUZE SPONGE 4X4 16PLY XRAY LF (GAUZE/BANDAGES/DRESSINGS) ×2 IMPLANT
GLOVE BIO SURGEON STRL SZ8 (GLOVE) ×2 IMPLANT
GLOVE BIOGEL PI IND STRL 7.0 (GLOVE) ×1 IMPLANT
GLOVE BIOGEL PI IND STRL 7.5 (GLOVE) ×1 IMPLANT
GLOVE BIOGEL PI INDICATOR 7.0 (GLOVE) ×1
GLOVE BIOGEL PI INDICATOR 7.5 (GLOVE) ×1
GLOVE ECLIPSE 6.5 STRL STRAW (GLOVE) ×2 IMPLANT
GLOVE ECLIPSE 7.5 STRL STRAW (GLOVE) ×2 IMPLANT
GLOVE EXAM NITRILE MD LF STRL (GLOVE) ×2 IMPLANT
GLOVE SURG SS PI 6.5 STRL IVOR (GLOVE) ×4 IMPLANT
GOWN BRE IMP SLV AUR LG STRL (GOWN DISPOSABLE) ×2 IMPLANT
GOWN BRE IMP SLV AUR XL STRL (GOWN DISPOSABLE) ×4 IMPLANT
GOWN STRL REIN 2XL LVL4 (GOWN DISPOSABLE) ×2 IMPLANT
HEAD HALTER (SOFTGOODS) IMPLANT
HEMOSTAT POWDER KIT SURGIFOAM (HEMOSTASIS) ×2 IMPLANT
IMPLT CONSTRUX LORDO 12X12X7MM (Orthopedic Implant) ×2 IMPLANT
IMPLT CONSTRUX LORDO 15X12X8MM (Orthopedic Implant) ×4 IMPLANT
IMPLT CONSTRUX LORDO 15X12X9MM (Orthopedic Implant) ×2 IMPLANT
KIT BASIN OR (CUSTOM PROCEDURE TRAY) ×2 IMPLANT
KIT ROOM TURNOVER OR (KITS) ×2 IMPLANT
NEEDLE HYPO 25X1 1.5 SAFETY (NEEDLE) ×2 IMPLANT
NEEDLE SPNL 20GX3.5 QUINCKE YW (NEEDLE) ×2 IMPLANT
NS IRRIG 1000ML POUR BTL (IV SOLUTION) ×2 IMPLANT
PACK LAMINECTOMY NEURO (CUSTOM PROCEDURE TRAY) ×2 IMPLANT
PAD ARMBOARD 7.5X6 YLW CONV (MISCELLANEOUS) ×8 IMPLANT
PUTTY BONE DBX 5CC MIX (Putty) ×2 IMPLANT
RUBBERBAND STERILE (MISCELLANEOUS) ×4 IMPLANT
SCREW FIXED SELF DRILL 4X15 (Screw) ×20 IMPLANT
SPONGE INTESTINAL PEANUT (DISPOSABLE) ×2 IMPLANT
SPONGE SURGIFOAM ABS GEL 100 (HEMOSTASIS) ×2 IMPLANT
STRIP CLOSURE SKIN 1/2X4 (GAUZE/BANDAGES/DRESSINGS) ×4 IMPLANT
SUT VIC AB 3-0 SH 8-18 (SUTURE) ×4 IMPLANT
SYR 20ML ECCENTRIC (SYRINGE) ×2 IMPLANT
TOWEL OR 17X24 6PK STRL BLUE (TOWEL DISPOSABLE) ×2 IMPLANT
TOWEL OR 17X26 10 PK STRL BLUE (TOWEL DISPOSABLE) ×2 IMPLANT
TRAP SPECIMEN MUCOUS 40CC (MISCELLANEOUS) ×2 IMPLANT
WATER STERILE IRR 1000ML POUR (IV SOLUTION) ×2 IMPLANT

## 2011-05-09 NOTE — Anesthesia Postprocedure Evaluation (Signed)
  Anesthesia Post-op Note  Patient: [name]  Procedure(s) Performed: Procedure(s) (LRB): ANTERIOR CERVICAL DECOMPRESSION/DISCECTOMY FUSION 4 LEVELS (N/A)  Patient Location: PACU  Anesthesia Type: General  Level of Consciousness: awake  Airway and Oxygen Therapy: Patient Spontanous Breathing  Post-op Pain: mild  Post-op Assessment: Post-op Vital signs reviewed  Post-op Vital Signs: Reviewed  Complications: No apparent anesthesia complications

## 2011-05-09 NOTE — Anesthesia Preprocedure Evaluation (Addendum)
Anesthesia Evaluation  Patient identified by MRN, date of birth, ID band Patient awake    Airway Mallampati: II      Dental   Pulmonary sleep apnea ,  breath sounds clear to auscultation        Cardiovascular Rhythm:Regular Rate:Normal     Neuro/Psych  Headaches,    GI/Hepatic negative GI ROS, Neg liver ROS,   Endo/Other  negative endocrine ROS  Renal/GU negative Renal ROS     Musculoskeletal   Abdominal   Peds  Hematology   Anesthesia Other Findings   Reproductive/Obstetrics                          Anesthesia Physical Anesthesia Plan  ASA: III  Anesthesia Plan: General   Post-op Pain Management:    Induction: Intravenous  Airway Management Planned: Oral ETT  Additional Equipment:   Intra-op Plan:   Post-operative Plan:   Informed Consent:   Plan Discussed with: CRNA  Anesthesia Plan Comments:         Anesthesia Quick Evaluation

## 2011-05-09 NOTE — H&P (Signed)
Subjective:   Patient is a 61 y.o. male admitted for ACDF. The patient first presented to me with complaints of neck pain and headaches with shoulder pain. Onset of symptoms was several years ago. The pain is described as aching, sharp, stabbing and throbbing and occurs all day. The pain is rated moderate, and is located at the neck and radiates to the shoulders. The symptoms have been progressive. Symptoms are exacerbated by extending head backwards, and are relieved by rest.  Previous work up includes MRI of cervical spine, results: spondylosis and CT of cervical spine, results: same.  Past Medical History  Diagnosis Date  . Hyperlipidemia   . Degenerative disk disease   . Sleep apnea     uses V pap   . Headache   . Spinal stenosis of lumbar region   . Depression   . Arthritis     Past Surgical History  Procedure Date  . Lumbar disc surgery   . Thoracic vertebral decompression   . Appendectomy   . Knee arthroscopy     x 2, bilateral  . Foot fusion     Right great toe  . Cataract extraction     Left  . Nasal septum surgery   . Eye surgery   . Lumbar laminectomy/decompression microdiscectomy 04/04/2011    Procedure: LUMBAR LAMINECTOMY/DECOMPRESSION MICRODISCECTOMY 2 LEVELS;  Surgeon: Tia Alert, MD;  Location: [facility];  Service: Neurosurgery;  Laterality: N/A;  lumbar laminectomy lumbar three-four, lumbar four-five    Allergies  Allergen Reactions  . Betadine (Povidone Iodine) Rash    Itching and hot, burning    History  Substance Use Topics  . Smoking status: Former Smoker -- 8 years    Types: Cigarettes    Quit date: 03/21/1981  . Smokeless tobacco: Never Used  . Alcohol Use: Not on file     daily glass of wine    Family History  Problem Relation Age of Onset  . Heart disease    . Cancer Mother   . Sleep apnea Brother   . Heart attack Brother   . Alcohol abuse Brother   . Heart attack Father   . Alcohol abuse Father   . Anesthesia problems Neg Hx     Prior to Admission medications   Medication Sig Start Date End Date Taking? Authorizing Provider  atorvastatin (LIPITOR) 40 MG tablet Take 40 mg by mouth daily.  01/15/11  Yes Historical Provider, MD  Calcium Carbonate-Vitamin D (CALTRATE 600+D) 600-400 MG-UNIT per tablet Take 1 tablet by mouth daily.    Yes Historical Provider, MD  DULoxetine (CYMBALTA) 60 MG capsule Take 60 mg by mouth daily.   Yes Historical Provider, MD  Multiple Vitamin (MULITIVITAMIN WITH MINERALS) TABS Take 1 tablet by mouth daily.   Yes Historical Provider, MD  silodosin (RAPAFLO) 8 MG CAPS capsule Take 8 mg by mouth every evening.   Yes Historical Provider, MD  Testosterone (ANDROGEL PUMP) 1.25 GM/ACT (1%) GEL Place 1 application onto the skin daily. 4 pumps once daily   Yes Historical Provider, MD  vitamin B-12 (CYANOCOBALAMIN) 1000 MCG tablet Take 1,000 mcg by mouth daily.     Yes Historical Provider, MD  aspirin EC 81 MG tablet Take 81 mg by mouth daily.    Historical Provider, MD  naproxen (NAPROSYN) 250 MG tablet Take 250 mg by mouth daily as needed. For pain    Historical Provider, MD  Omega-3 Fatty Acids (FISH OIL PO) Take 1 capsule by mouth daily.  Historical Provider, MD  zolpidem (AMBIEN) 10 MG tablet Take 10 mg by mouth At bedtime as needed. For sleep 11/15/10   Historical Provider, MD     Review of Systems  Positive ROS: neg  All other systems have been reviewed and were otherwise negative with the exception of those mentioned in the HPI and as above.  Objective: Vital signs in last 24 hours: Temp:  [97.2 F (36.2 C)] 97.2 F (36.2 C) (03/27 0649) Pulse Rate:  [57] 57  (03/27 0649) Resp:  [18] 18  (03/27 0649) BP: (111)/(69) 111/69 mmHg (03/27 0649) SpO2:  [99 %] 99 % (03/27 0649)  General Appearance: Alert, cooperative, no distress, appears stated age Head: Normocephalic, without obvious abnormality, atraumatic Eyes: PERRL, conjunctiva/corneas clear, EOM's intact, fundi benign, both eyes       Ears: Normal TM's and external ear canals, both ears Throat: Lips, mucosa, and tongue normal; teeth and gums normal Neck: Supple, symmetrical, trachea midline, no adenopathy; thyroid: No enlargement/tenderness/nodules; no carotid bruit or JVD Back: Symmetric, no curvature, ROM normal, no CVA tenderness Lungs: Clear to auscultation bilaterally, respirations unlabored Heart: Regular rate and rhythm, S1 and S2 normal, no murmur, rub or gallop Abdomen: Soft, non-tender, bowel sounds active all four quadrants, no masses, no organomegaly Extremities: Extremities normal, atraumatic, no cyanosis or edema Pulses: 2+ and symmetric all extremities Skin: Skin color, texture, turgor normal, no rashes or lesions  NEUROLOGIC:  Mental status: Alert and oriented x4, no aphasia, good attention span, fund of knowledge and memory  Motor Exam - grossly normal Sensory Exam - grossly normal Reflexes: increased Coordination - grossly normal Gait - mild spasticity Balance - grossly normal Cranial Nerves: I: smell Not tested  II: visual acuity  OS: nl    OD: nl  II: visual fields Full to confrontation  II: pupils Equal, round, reactive to light  III,VII: ptosis None  III,IV,VI: extraocular muscles  Full ROM  V: mastication Normal  V: facial light touch sensation  Normal  V,VII: corneal reflex  Present  VII: facial muscle function - upper  Normal  VII: facial muscle function - lower Normal  VIII: hearing Not tested  IX: soft palate elevation  Normal  IX,X: gag reflex Present  XI: trapezius strength  5/5  XI: sternocleidomastoid strength 5/5  XI: neck flexion strength  5/5  XII: tongue strength  Normal    Data Review Lab Results  Component Value Date   WBC 5.3 05/01/2011   HGB 14.8 05/01/2011   HCT 41.5 05/01/2011   MCV 92.4 05/01/2011   PLT 183 05/01/2011   Lab Results  Component Value Date   NA 137 05/01/2011   K 4.6 05/01/2011   CL 101 05/01/2011   CO2 25 05/01/2011   BUN 15 05/01/2011    CREATININE 0.84 05/01/2011   GLUCOSE 92 05/01/2011   Lab Results  Component Value Date   INR 1.01 05/01/2011    Assessment:   Cervical neck pain with herniated nucleus pulposus/ spondylosis/ stenosis at [id number]. Patient has failed conservative therapy. Planned surgery : 4 level ACDF  Plan:   I explained the condition and procedure to the patient and answered any questions.  Patient wishes to proceed with procedure as planned. Understands risks/ benefits/ and expected or typical outcomes.  Eira Alpert S 05/09/2011 8:22 AM

## 2011-05-09 NOTE — Anesthesia Procedure Notes (Addendum)
Procedure Name: Intubation Date/Time: 05/09/2011 8:41 AM Performed by: Luster Landsberg Pre-anesthesia Checklist: Patient identified, Emergency Drugs available, Suction available and Patient being monitored Patient Re-evaluated:Patient Re-evaluated prior to inductionOxygen Delivery Method: Circle system utilized Preoxygenation: Pre-oxygenation with 100% oxygen Intubation Type: IV induction Ventilation: Mask ventilation without difficulty and Oral airway inserted - appropriate to patient size Laryngoscope Size: Mac and 4 Grade View: Grade II Tube type: Oral Number of attempts: 1 Airway Equipment and Method: Stylet Placement Confirmation: ETT inserted through vocal cords under direct vision,  positive ETCO2 and breath sounds checked- equal and bilateral Secured at: 24 cm Tube secured with: Tape Dental Injury: Teeth and Oropharynx as per pre-operative assessment

## 2011-05-09 NOTE — Transfer of Care (Signed)
Immediate Anesthesia Transfer of Care Note  Patient: [name]  Procedure(s) Performed: Procedure(s) (LRB): ANTERIOR CERVICAL DECOMPRESSION/DISCECTOMY FUSION 4 LEVELS (N/A)  Patient Location: PACU  Anesthesia Type: General  Level of Consciousness: awake  Airway & Oxygen Therapy: Patient Spontanous Breathing and Patient connected to face mask oxygen  Post-op Assessment: Report given to PACU RN, Post -op Vital signs reviewed and stable and Patient moving all extremities  Post vital signs: Reviewed and stable  Complications: No apparent anesthesia complications

## 2011-05-09 NOTE — Op Note (Signed)
05/09/2011  12:27 PM  PATIENT:  Lyndee Hensen  61 y.o. male  PRE-OPERATIVE DIAGNOSIS:  Cervical spondylosis C3-4 C4-5 C5-6 C6-7 with neck and shoulder pain and headaches  POST-OPERATIVE DIAGNOSIS:  Same  PROCEDURE:  1. Decompressive anterior cervical discectomy C3-4 C4-5 C5-6 C6-7, 2. Anterior cervical arthrodesis C3-4 C4-5 C5-6 C6-7 utilizing peek interbody cages packed with local autograft and morcellized allograft, 3. Anterior cervical plating utilizing the nuvasive translational plate [id number]  SURGEON:  Marikay Alar, MD  ASSISTANTS: Dr. Franky Macho  ANESTHESIA:   General  EBL: 125 ml  Total I/O In: 2250 [I.V.:2000; IV Piggyback:250] Out: 575 [Urine:450; Blood:125]  BLOOD ADMINISTERED:none  DRAINS: 7 flat JP   SPECIMEN:  No Specimen  INDICATION FOR PROCEDURE: This is a patient of mine who presented with severe neck pain with headaches of many years duration. MRI and CT scan showed multilevel cervical spondylosis. He tried medical management without relief. Recommended a 4 level ACDF with plating in hopes of improving his pain syndrome. Patient understood the risks, benefits, and alternatives and potential outcomes and wished to proceed.  PROCEDURE DETAILS: Patient was brought to the operating room placed under general endotracheal anesthesia. Patient was placed in the supine position on the operating room table. The neck was prepped with Duraprep and draped in a sterile fashion.   Three cc of local anesthesia was injected and a transverse incision was made on the right side of the neck.  Dissection was carried down thru the subcutaneous tissue and the platysma was  elevated, opened, and undermined with Metzenbaum scissors.  Dissection was then carried out thru an avascular plane leaving the sternocleidomastoid carotid artery and jugular vein laterally and the trachea and esophagus medially. The ventral aspect of the vertebral column was identified and a localizing x-ray was taken. The  C4-5 level was identified. The longus colli muscles were then elevated from C3-C7 and the retractor was placed. The annulus was incised at each level and the disc space entered. Discectomy was performed with micro-curettes and pituitary rongeurs. I then used the high-speed drill to drill the endplates down to the level of the posterior longitudinal ligament at each level. The drill shavings were saved in a mucous trap for later arthrodesis. The disc space was drilled to a height of 7-8 mm at every level. The operating microscope was draped and brought into the field provided additional magnification, illumination and visualization. Discectomy was continued posteriorly thru the disc space. Posterior longitudinal ligament was opened with a nerve hook, and then removed along with disc herniation and osteophytes, decompressing the spinal canal and thecal sac. We then continued to remove osteophytic overgrowth and disc material decompressing the neural foramina and exiting nerve roots bilaterally. The scope was angled up and down to help decompress and undercut the vertebral bodies. Once the decompression was completed we could pass a nerve hook circumferentially to assure adequate decompression in the midline and in the neural foramina. So by both visualization and palpation we felt we had an adequate decompression of the neural elements. We then measured the height of the intravertebral disc space and selected a 8 millimeter Peek interbody cage packed with autograft and morcellized allograft for C4-5 C5-6 and C6-7. A 7 mm cage was used to C3-4. These were then gently positioned in the intravertebral disc space and countersunk. I then used a 86 mm nuvasive translational plate and placed 15 mm fixed angle screws into the vertebral bodies from C3-C7 and locked them into position. The wound was irrigated  with bacitracin solution, checked for hemostasis which was established and confirmed. The surgical bed was dried with  bipolar cautery and Surgifoam. It was then irrigated and a 7 flat JP drain was placed. Once meticulous hemostasis was achieved, we then proceeded with closure. The platysma was closed with interrupted 3-0 undyed Vicryl suture, the subcuticular layer was closed with interrupted 3-0 undyed Vicryl suture. The skin edges were approximated with steristrips. The drapes were removed. A sterile dressing was applied. The patient was then awakened from general anesthesia and transferred to the recovery room in stable condition. At the end of the procedure all sponge, needle and instrument counts were correct.   PLAN OF CARE: Admit to inpatient   PATIENT DISPOSITION:  PACU - hemodynamically stable.   Delay start of Pharmacological VTE agent (>24hrs) due to surgical blood loss or risk of bleeding:  yes

## 2011-05-09 NOTE — Progress Notes (Signed)
Pt foley catheter removed at 1330. Pt unable to void by 2000. Pt stated he was feeling bladder pressure. Bladder scan revealed >929ml. Intermittent straight cath performed. Urine . Will cont' to monitor patient.

## 2011-05-09 NOTE — Progress Notes (Signed)
Pt assisted to bathroom, attempting to void at this time. Tolerated getting up without difficultly.

## 2011-05-10 NOTE — Progress Notes (Signed)
Patient ID: [name], male   DOB: 06-30-1950, 61 y.o.   MRN: [id number] Subjective: Patient reports aprop soreness. No arm pain or N/T/W  Objective: Vital signs in last 24 hours: Temp:  [96.8 F (36 C)-98.3 F (36.8 C)] 98.3 F (36.8 C) (03/28 0541) Pulse Rate:  [65-110] 65  (03/28 0541) Resp:  [18-28] 20  (03/28 0541) BP: (116-193)/(50-97) 116/50 mmHg (03/28 0541) SpO2:  [94 %-98 %] 97 % (03/28 0541) Weight:  [92.987 kg (205 lb)] 92.987 kg (205 lb) (03/27 1534)  Intake/Output from previous day: 03/27 0701 - 03/28 0700 In: 2885 [I.V.:2400; IV Piggyback:250] Out: 3195 [Urine:2950; Drains:120; Blood:125] Intake/Output this shift: Total I/O In: 240 [P.O.:240] Out: 725 [Urine:725]  Neurologic: Grossly normal with stable mild thoracic myelopathy  Lab Results: Lab Results  Component Value Date   WBC 5.3 05/01/2011   HGB 14.8 05/01/2011   HCT 41.5 05/01/2011   MCV 92.4 05/01/2011   PLT 183 05/01/2011   Lab Results  Component Value Date   INR 1.01 05/01/2011   BMET Lab Results  Component Value Date   NA 137 05/01/2011   K 4.6 05/01/2011   CL 101 05/01/2011   CO2 25 05/01/2011   GLUCOSE 92 05/01/2011   BUN 15 05/01/2011   CREATININE 0.84 05/01/2011   CALCIUM 10.1 05/01/2011    Studies/Results: Dg Cervical Spine 2-3 Views  05/09/2011  *RADIOLOGY REPORT*  Clinical Data: 61 year old male undergoing cervical spine surgery.  CERVICAL SPINE - 2-3 VIEW  Comparison:  Fluoroscopy time of 0.3 minutes was utilized.  Findings: Cervical ACDF hardware extends from the C3 to the C7 level on these three intraoperative fluoroscopic views of the cervical spine.  IMPRESSION: C3-C4 through C6-C7 ACDF.  Original Report Authenticated By: Harley Hallmark, M.D.    Assessment/Plan: Doing well, cont JP drain, Home tomorrow.   LOS: 1 day    Rudean Icenhour S 05/10/2011, 9:56 AM

## 2011-05-10 NOTE — Progress Notes (Signed)
UR COMPLETED  

## 2011-05-10 NOTE — Progress Notes (Signed)
Pt was able to void a total of from 5 attempts. Pt continued to feel bladder pressure. Bladder scan revealed . Straight cath performed yielding of urine.

## 2011-05-11 MED ORDER — OXYCODONE-ACETAMINOPHEN 5-325 MG PO TABS: 1.0000 | ORAL_TABLET | ORAL | Status: AC | PRN

## 2011-05-11 NOTE — Discharge Summary (Signed)
Physician Discharge Summary  Patient ID: [name] MRN: [id number] DOB/AGE: 06/24/1950 61 y.o.  Admit date: 05/09/2011 Discharge date: 05/11/2011  Admission Diagnoses:  Discharge Diagnoses:  Active Problems:  * No active hospital problems. *    Discharged Condition: good  Hospital Course: Surgery Wednesday with multi level acdf. Did well. Home POD 2. Felt better. Wound fine. Neuro intact.  Consults: None  Significant Diagnostic Studies: none  Treatments: 4 level acdf  Discharge Exam: Blood pressure 134/73, pulse 54, temperature 98.1 F (36.7 C), temperature source Oral, resp. rate 18, height 6\' 3"  (1.905 m), weight 92.987 kg (205 lb), SpO2 95.00%. Incision/Wound:Looks perfect. Instructions given   Disposition: 01-Home or Self Care  Discharge Orders    Future Appointments: Provider: Department: Dept Phone: Center:   02/21/2012 9:00 AM Waymon Budge, MD [facility] [phone] None     Future Orders Please Complete By Expires   Diet general      Discharge instructions      Comments:   Mostly bedrest. Get up 9 or 10 times each day and walk for 15-20 minutes each time. Very little sitting the first week. No riding in the car until your first post op appointment. If you had neck surgery...may shower from the chest down. If you had low back surgery....you may shower with a saran wrap covering over the incision. Take your pain medicine as needed...and other medicines that you are instructed to take. Call for an appointment...[phone].   Call MD for:  temperature >100.4      Call MD for:  persistant nausea and vomiting      Call MD for:  severe uncontrolled pain      Call MD for:  redness, tenderness, or signs of infection (pain, swelling, redness, odor or green/yellow discharge around incision site)      Call MD for:  difficulty breathing, headache or visual disturbances      Call MD for:  hives        Medication List  As of 05/11/2011  8:30 AM   STOP taking these  medications         aspirin EC 81 MG tablet      naproxen 250 MG tablet         TAKE these medications         ANDROGEL PUMP 1.25 GM/ACT (1%) Gel   Generic drug: Testosterone   Place 1 application onto the skin daily. 4 pumps once daily      atorvastatin 40 MG tablet   Commonly known as: LIPITOR   Take 40 mg by mouth daily.      CALTRATE 600+D 600-400 MG-UNIT per tablet   Generic drug: Calcium Carbonate-Vitamin D   Take 1 tablet by mouth daily.      DULoxetine 60 MG capsule   Commonly known as: CYMBALTA   Take 60 mg by mouth daily.      FISH OIL PO   Take 1 capsule by mouth daily.      mulitivitamin with minerals Tabs   Take 1 tablet by mouth daily.      oxyCODONE-acetaminophen 5-325 MG per tablet   Commonly known as: PERCOCET   Take 1-2 tablets by mouth every 4 (four) hours as needed.      silodosin 8 MG Caps capsule   Commonly known as: RAPAFLO   Take 8 mg by mouth every evening.      vitamin B-12 1000 MCG tablet   Commonly known as: CYANOCOBALAMIN  Take 1,000 mcg by mouth daily.      zolpidem 10 MG tablet   Commonly known as: AMBIEN   Take 10 mg by mouth At bedtime as needed. For sleep             At home rest most of the time. Get up 9 or 10 times each day and take a 15 or 20 minute walk. No riding in the car and to your first postoperative appointment. If you have neck surgery you may shower from the chest down starting on the third postoperative day. If you had back surgery he may start showering on the third postoperative day with saran wrap wrapped around your incisional area 3 times. After the shower remove the saran wrap. Take pain medicine as needed and other medications as instructed. Call my office for an appointment.  SignedReinaldo Meeker, MD 05/11/2011, 8:30 AM

## 2012-02-21 ENCOUNTER — Ambulatory Visit (INDEPENDENT_AMBULATORY_CARE_PROVIDER_SITE_OTHER): Payer: BC Managed Care – PPO | Admitting: Internal Medicine

## 2012-02-21 ENCOUNTER — Encounter: Payer: Self-pay | Admitting: Internal Medicine

## 2012-02-21 VITALS — BP 114/64 | HR 55 | Ht 75.0 in | Wt 197.6 lb

## 2012-02-21 DIAGNOSIS — G4733 Obstructive sleep apnea (adult) (pediatric): Secondary | ICD-10-CM

## 2012-02-21 NOTE — Patient Instructions (Addendum)
We can continue with VPAP 15.6 cwp  You might want to check out the prices on [email or web address].com for comparison.  Please call as needed

## 2012-02-21 NOTE — Progress Notes (Signed)
02/16/11- 60 yoM former smoker, followed for OSA, complicated by arthritis in spine. LOV-02/16/2010 Continues using VPAP all night, every night. He sets his own pressure and brings his own download report which is filed. Compliance has been quite good. Pressure is around 14 with AHI 6.7 per hour. Had flu vaccine.  02/21/12-60 yoM former smoker, followed for OSA, complicated by arthritis in spine. FOLLOWS FOR: wear VPAP/ ResMed every night for about 8 hours and adjusts pressure as needed. We reviewed his printouts and I explained the terms. He had to leave it off for a while after cervical spine surgery in March of 2013. Since then he has tried increasing pressure from 14.6-15.6. This resulted in a little more leak. He snores through a little at times but says he sleeps fine and feels rested CXR 03/22/11 IMPRESSION:  No acute disease.  Original Report Authenticated By: Bernadene Bell. D'ALESSIO, M.D.  ROS-see HPI Constitutional:   No-   weight loss, night sweats, fevers, chills, fatigue, lassitude. HEENT:   No-  headaches, difficulty swallowing, tooth/dental problems, sore throat,       No-  sneezing, itching, ear ache, nasal congestion, post nasal drip,  CV:  No-   chest pain, orthopnea, PND, swelling in lower extremities, anasarca, dizziness, palpitations Resp: No-   shortness of breath with exertion or at rest.              No-   productive cough,  No non-productive cough,  No- coughing up of blood.              No-   change in color of mucus.  No- wheezing.   Skin: No-   rash or lesions. GI:  No-   heartburn, indigestion, abdominal pain, nausea, vomiting,  GU:. MS:  No-   joint pain or swelling.  + Chronic back pain. Neuro-     nothing unusual Psych:  No- change in mood or affect. No depression or anxiety.  No memory loss.  OBJ General- Alert, Oriented, Affect-appropriate, Distress- none acute, tall, trim, comfortable appearing. Skin- rash-none, lesions- none, excoriation- none Lymphadenopathy-  none Head- atraumatic            Eyes- Gross vision intact, PERRLA, conjunctivae clear secretions            Ears- Hearing, canals-normal            Nose- Clear, no-Septal dev, mucus, polyps, erosion, perforation             Throat- Mallampati II-III , mucosa clear , drainage- none, tonsils- atrophic Neck- flexible , trachea midline, no stridor , thyroid nl, carotid no bruit Chest - symmetrical excursion , unlabored           Heart/CV- RRR , no murmur , no gallop  , no rub, nl s1 s2                           - JVD- none , edema- none, stasis changes- none, varices- none           Lung- clear to P&A, wheeze- none, cough- none , dullness-none, rub- none           Chest wall-  Abd- Br/ Gen/ Rectal- Not done, not indicated Extrem- cyanosis- none, clubbing, none, atrophy- none, strength- nl Neuro- grossly intact to observation

## 2012-03-02 NOTE — Assessment & Plan Note (Signed)
Higher pressure is associated with higher leak. Compliance is very good. On this pressure his AHI has ranged from 9.9-12.1 which is not ideal. Symptomatically he is comfortable and satisfied so we will leave him there.

## 2012-09-11 ENCOUNTER — Encounter: Payer: Self-pay | Admitting: Gastroenterology

## 2012-09-14 ENCOUNTER — Other Ambulatory Visit: Payer: Self-pay | Admitting: Neurological Surgery

## 2012-09-14 DIAGNOSIS — M5124 Other intervertebral disc displacement, thoracic region: Secondary | ICD-10-CM

## 2012-09-14 DIAGNOSIS — M542 Cervicalgia: Secondary | ICD-10-CM

## 2012-09-18 ENCOUNTER — Ambulatory Visit
Admission: RE | Admit: 2012-09-18 | Discharge: 2012-09-18 | Disposition: A | Discharge status: Home / Self Care | Payer: BC Managed Care – PPO | Source: Ambulatory Visit | Attending: Neurological Surgery | Admitting: Neurological Surgery

## 2012-09-18 DIAGNOSIS — M5124 Other intervertebral disc displacement, thoracic region: Secondary | ICD-10-CM

## 2012-09-18 DIAGNOSIS — M542 Cervicalgia: Secondary | ICD-10-CM

## 2012-09-18 MED ORDER — IOHEXOL 300 MG/ML  SOLN: 10.0000 mL | Freq: Once | INTRAMUSCULAR | Status: AC | PRN

## 2012-09-18 NOTE — Progress Notes (Signed)
Patient took dose of Cymbalta yesterday.  Rescheduled to tomorrow, 09/19/12, @1300 .  jkl

## 2012-09-19 ENCOUNTER — Other Ambulatory Visit: Payer: BC Managed Care – PPO

## 2012-09-19 ENCOUNTER — Ambulatory Visit
Admission: RE | Admit: 2012-09-19 | Discharge: 2012-09-19 | Disposition: A | Discharge status: Home / Self Care | Payer: BC Managed Care – PPO | Source: Ambulatory Visit | Attending: Neurological Surgery | Admitting: Neurological Surgery

## 2012-09-19 ENCOUNTER — Inpatient Hospital Stay
Admission: RE | Admit: 2012-09-19 | Discharge: 2012-09-19 | Disposition: A | Discharge status: Home / Self Care | Payer: BC Managed Care – PPO | Source: Ambulatory Visit | Attending: Neurological Surgery | Admitting: Neurological Surgery

## 2012-09-19 ENCOUNTER — Other Ambulatory Visit: Payer: Self-pay | Admitting: Neurological Surgery

## 2012-09-19 VITALS — BP 133/63 | HR 48

## 2012-09-19 DIAGNOSIS — M542 Cervicalgia: Secondary | ICD-10-CM

## 2012-09-19 DIAGNOSIS — M549 Dorsalgia, unspecified: Secondary | ICD-10-CM

## 2012-09-19 MED ORDER — IOHEXOL 300 MG/ML  SOLN
10.0000 mL | Freq: Once | INTRAMUSCULAR | Status: AC | PRN
  Administered 2012-09-19: 10 mL via INTRATHECAL

## 2012-09-19 MED ORDER — DIAZEPAM 5 MG PO TABS
10.0000 mg | ORAL_TABLET | Freq: Once | ORAL | Status: AC
  Administered 2012-09-19: 10 mg via ORAL

## 2012-09-19 NOTE — Progress Notes (Signed)
Patient states he has been off Cymbalta for the past two days.  Discharge instructions explained to patient.  jkl

## 2012-11-26 ENCOUNTER — Telehealth: Payer: Self-pay | Admitting: Internal Medicine

## 2012-11-26 DIAGNOSIS — G4733 Obstructive sleep apnea (adult) (pediatric): Secondary | ICD-10-CM

## 2012-11-26 NOTE — Telephone Encounter (Signed)
Cy, are you okay with [place] placing order to [facility] for new VPAP machine (currently uses now at INSP 15.6 cwp) he sets his own pressure according to [facility] notes. Thanks.

## 2012-11-27 NOTE — Telephone Encounter (Signed)
Order has been placed. Pt's wife is aware.

## 2012-11-27 NOTE — Telephone Encounter (Signed)
Ok DME script replacement VPAP machine, pressure 14 but patient may self adjust, humidifier, mask of choice, supplies  Dx OSA

## 2012-12-09 ENCOUNTER — Encounter: Payer: Self-pay | Admitting: Gastroenterology

## 2012-12-18 ENCOUNTER — Other Ambulatory Visit: Payer: Self-pay

## 2012-12-18 ENCOUNTER — Telehealth: Payer: Self-pay | Admitting: Internal Medicine

## 2012-12-18 NOTE — Telephone Encounter (Signed)
I spoke with [name]. She reports she has already made pt aware of this. He was fine with this. Nothing further needed

## 2012-12-22 ENCOUNTER — Encounter (HOSPITAL_COMMUNITY): Payer: Self-pay

## 2012-12-23 ENCOUNTER — Other Ambulatory Visit: Payer: Self-pay | Admitting: Neurological Surgery

## 2012-12-29 ENCOUNTER — Other Ambulatory Visit (HOSPITAL_COMMUNITY): Payer: BC Managed Care – PPO

## 2013-01-07 ENCOUNTER — Telehealth: Payer: Self-pay | Admitting: *Deleted

## 2013-01-07 ENCOUNTER — Ambulatory Visit (AMBULATORY_SURGERY_CENTER): Payer: Self-pay | Admitting: *Deleted

## 2013-01-07 VITALS — Ht 74.5 in | Wt 204.8 lb

## 2013-01-07 DIAGNOSIS — Z1211 Encounter for screening for malignant neoplasm of colon: Secondary | ICD-10-CM

## 2013-01-07 MED ORDER — NA SULFATE-K SULFATE-MG SULF 17.5-3.13-1.6 GM/177ML PO SOLN: ORAL | Status: DC

## 2013-01-07 NOTE — Telephone Encounter (Signed)
John, I just wanted you to be aware of this pt's multiple neck surgeries before his procedure.  He is scheduled for another neck surgery on 01-28-13  Thanks, Baxter Hire

## 2013-01-07 NOTE — Progress Notes (Signed)
No egg or soy allergy  Pt has a procedure from 2007 in [facility] with Dr. Juanda Chance- I verified with pt that he did not have a procedure at this time.  His last procedure was in 2004 with Dr. Arlyce Dice.  I spoke with Trish in medical records about this and she will attempt to remove this report from this pt's chart  Pt having neck surgery 01-28-13- I sent a note to J Nulty about his pt's multiple neck surgeries

## 2013-01-12 ENCOUNTER — Encounter: Payer: Self-pay | Admitting: Gastroenterology

## 2013-01-12 NOTE — Telephone Encounter (Signed)
Thanks Kristen 

## 2013-01-13 ENCOUNTER — Telehealth: Payer: Self-pay | Admitting: Gastroenterology

## 2013-01-13 NOTE — Telephone Encounter (Signed)
Discussed with Dr. Arlyce Dice, pt phone call concerns. Dr. Arlyce Dice says ok to proceed with procedure. Phone call to patient and told him, clear liquids only today and start prep this evening. Follow through with planned procedure tomorrow.

## 2013-01-14 ENCOUNTER — Ambulatory Visit (AMBULATORY_SURGERY_CENTER): Payer: BC Managed Care – PPO | Admitting: Gastroenterology

## 2013-01-14 ENCOUNTER — Encounter: Payer: Self-pay | Admitting: Gastroenterology

## 2013-01-14 VITALS — BP 128/67 | HR 50 | Temp 96.9°F | Resp 13 | Ht 74.5 in | Wt 204.0 lb

## 2013-01-14 DIAGNOSIS — K648 Other hemorrhoids: Secondary | ICD-10-CM

## 2013-01-14 DIAGNOSIS — Z1211 Encounter for screening for malignant neoplasm of colon: Secondary | ICD-10-CM

## 2013-01-14 MED ORDER — SODIUM CHLORIDE 0.9 % IV SOLN: 500.0000 mL | INTRAVENOUS | Status: DC

## 2013-01-14 NOTE — Patient Instructions (Signed)
Discharge instructions given with verbal understanding. Handouts on hemorrhoids and a high fiber diet. resume previous medications. YOU HAD AN ENDOSCOPIC PROCEDURE TODAY AT THE [facility]: Refer to the procedure report that was given to you for any specific questions about what was found during the examination.  If the procedure report does not answer your questions, please call your gastroenterologist to clarify.  If you requested that your care partner not be given the details of your procedure findings, then the procedure report has been included in a sealed envelope for you to review at your convenience later.  YOU SHOULD EXPECT: Some feelings of bloating in the abdomen. Passage of more gas than usual.  Walking can help get rid of the air that was put into your GI tract during the procedure and reduce the bloating. If you had a lower endoscopy (such as a colonoscopy or flexible sigmoidoscopy) you may notice spotting of blood in your stool or on the toilet paper. If you underwent a bowel prep for your procedure, then you may not have a normal bowel movement for a few days.  DIET: Your first meal following the procedure should be a light meal and then it is ok to progress to your normal diet.  A half-sandwich or bowl of soup is an example of a good first meal.  Heavy or fried foods are harder to digest and may make you feel nauseous or bloated.  Likewise meals heavy in dairy and vegetables can cause extra gas to form and this can also increase the bloating.  Drink plenty of fluids but you should avoid alcoholic beverages for 24 hours.  ACTIVITY: Your care partner should take you home directly after the procedure.  You should plan to take it easy, moving slowly for the rest of the day.  You can resume normal activity the day after the procedure however you should NOT DRIVE or use heavy machinery for 24 hours (because of the sedation medicines used during the test).    SYMPTOMS TO REPORT  IMMEDIATELY: A gastroenterologist can be reached at any hour.  During normal business hours, 8:30 AM to 5:00 PM Monday through Friday, call [phone].  After hours and on weekends, please call the GI answering service at [phone] who will take a message and have the physician on call contact you.   Following lower endoscopy (colonoscopy or flexible sigmoidoscopy):  Excessive amounts of blood in the stool  Significant tenderness or worsening of abdominal pains  Swelling of the abdomen that is new, acute  Fever of 100F or higher  FOLLOW UP: If any biopsies were taken you will be contacted by phone or by letter within the next 1-3 weeks.  Call your gastroenterologist if you have not heard about the biopsies in 3 weeks.  Our staff will call the home number listed on your records the next business day following your procedure to check on you and address any questions or concerns that you may have at that time regarding the information given to you following your procedure. This is a courtesy call and so if there is no answer at the home number and we have not heard from you through the emergency physician on call, we will assume that you have returned to your regular daily activities without incident.  SIGNATURES/CONFIDENTIALITY: You and/or your care partner have signed paperwork which will be entered into your electronic medical record.  These signatures attest to the fact that that the information above on your  After Visit Summary has been reviewed and is understood.  Full responsibility of the confidentiality of this discharge information lies with you and/or your care-partner.

## 2013-01-14 NOTE — Progress Notes (Signed)
Patient did not experience any of the following events: a burn prior to discharge; a fall within the facility; wrong site/side/patient/procedure/implant event; or a hospital transfer or hospital admission upon discharge from the facility. (G8907) Patient did not have preoperative order for IV antibiotic SSI prophylaxis. (G8918)  

## 2013-01-14 NOTE — Op Note (Signed)
[facility] [address] [place], [address]   COLONOSCOPY PROCEDURE REPORT  PATIENT: [name], [name]  MR#: [id number] BIRTHDATE: 01/25/1951 , 62  yrs. old GENDER: Male ENDOSCOPIST: Louis Meckel, MD REFERRED XB:[id number] Avva, M.D. PROCEDURE DATE:  01/14/2013 PROCEDURE:   Colonoscopy, diagnostic First Screening Colonoscopy - Avg.  risk and is 50 yrs.  old or older - No.  Prior Negative Screening - Now for repeat screening. 10 or more years since last screening  History of Adenoma - Now for follow-up colonoscopy & has been > or = to 3 yrs.  N/A  Polyps Removed Today? No.  Recommend repeat exam, <10 yrs? No. ASA CLASS:   Class II INDICATIONS:Average risk patient for colon cancer. MEDICATIONS: MAC sedation, administered by CRNA and propofol (Diprivan) 350mg  IV  DESCRIPTION OF PROCEDURE:   After the risks benefits and alternatives of the procedure were thoroughly explained, informed consent was obtained.  A digital rectal exam revealed no abnormalities of the rectum.   The LB [id number]  endoscope was introduced through the anus and advanced to the cecum, which was identified by both the appendix and ileocecal valve. No adverse events experienced.   The quality of the prep was excellent using Suprep  The instrument was then slowly withdrawn as the colon was fully examined.      COLON FINDINGS: Internal hemorrhoids were found.   The colon was otherwise normal.  There was no diverticulosis, inflammation, polyps or cancers unless previously stated.  Retroflexed views revealed no abnormalities. The time to cecum=11 minutes 20 seconds. Withdrawal time=7 minutes 12 seconds.  The scope was withdrawn and the procedure completed. COMPLICATIONS: There were no complications.  ENDOSCOPIC IMPRESSION: 1.   Internal hemorrhoids 2.   The colon was otherwise normal  RECOMMENDATIONS: Continue current colorectal screening recommendations for "routine risk"  patients with a repeat colonoscopy in 10 years.   eSigned:  Louis Meckel, MD 01/14/2013 2:20 PM   cc:   PATIENT NAME:  [name], [name] MR#: [id number]

## 2013-01-14 NOTE — Progress Notes (Signed)
Procedure ends, to recovery, report given and VSS. 

## 2013-01-15 ENCOUNTER — Telehealth: Payer: Self-pay | Admitting: *Deleted

## 2013-01-15 NOTE — Telephone Encounter (Signed)
  Follow up Call-  Call back number 01/14/2013  Post procedure Call Back phone  # [phone]  Permission to leave phone message Yes     Patient questions:  Do you have a fever, pain , or abdominal swelling? no Pain Score  0 *  Have you tolerated food without any problems? yes  Have you been able to return to your normal activities? yes  Do you have any questions about your discharge instructions: Diet   no Medications  no Follow up visit  no  Do you have questions or concerns about your Care? no  Actions: * If pain score is 4 or above: No action needed, pain <4.

## 2013-01-16 NOTE — Pre-Procedure Instructions (Signed)
CURLEY HOGEN  01/16/2013   Your procedure is scheduled on:  Wednesday, December 17th.  Report to [facility], Main Entrance / Entrance "A" at 11:30AM.  Call this number if you have problems the morning of surgery: [phone]   Remember:   Do not eat food or drink liquids after midnight Tuesday.   Take these medicines the morning of surgery with A SIP OF WATER: Cymbalta   Do not wear jewelry, make-up or nail polish.  Do not wear lotions, powders, or perfumes. You may wear deodorant.   Men may shave face and neck.  Do not bring valuables to the hospital.  [facility] is not responsible  for any belongings or valuables.               Contacts, dentures or bridgework may not be worn into surgery.  Leave suitcase in the car. After surgery it may be brought to your room.  For patients admitted to the hospital, discharge time is determined by your treatment team.               Patients discharged the day of surgery will not be allowed to drive home.  Name and phone number of your driver: -   Special Instructions: Shower using CHG 2 nights before surgery and the night before surgery.  If you shower the day of surgery use CHG.  Use special wash - you have one bottle of CHG for all showers.  You should use approximately 1/3 of the bottle for each shower.   Please read over the following fact sheets that you were given: Pain Booklet, Coughing and Deep Breathing and Surgical Site Infection Prevention

## 2013-01-19 ENCOUNTER — Encounter (HOSPITAL_COMMUNITY): Payer: Self-pay

## 2013-01-19 ENCOUNTER — Encounter (HOSPITAL_COMMUNITY)
Admission: RE | Admit: 2013-01-19 | Discharge: 2013-01-19 | Disposition: A | Discharge status: Home / Self Care | Payer: BC Managed Care – PPO | Source: Ambulatory Visit | Attending: Neurological Surgery | Admitting: Neurological Surgery

## 2013-01-19 ENCOUNTER — Ambulatory Visit (HOSPITAL_COMMUNITY)
Admission: RE | Admit: 2013-01-19 | Discharge: 2013-01-19 | Disposition: A | Discharge status: Home / Self Care | Payer: BC Managed Care – PPO | Source: Ambulatory Visit | Attending: Neurological Surgery | Admitting: Neurological Surgery

## 2013-01-19 DIAGNOSIS — Z01812 Encounter for preprocedural laboratory examination: Secondary | ICD-10-CM | POA: Insufficient documentation

## 2013-01-19 DIAGNOSIS — Z01818 Encounter for other preprocedural examination: Secondary | ICD-10-CM | POA: Insufficient documentation

## 2013-01-19 DIAGNOSIS — Z01810 Encounter for preprocedural cardiovascular examination: Secondary | ICD-10-CM | POA: Insufficient documentation

## 2013-01-19 HISTORY — DX: Other abnormalities of gait and mobility: R26.89

## 2013-01-19 HISTORY — DX: Unspecified hemorrhoids: K64.9

## 2013-01-19 HISTORY — DX: Adverse effect of unspecified anesthetic, initial encounter: T41.45XA

## 2013-01-19 HISTORY — DX: Other complications of anesthesia, initial encounter: T88.59XA

## 2013-01-19 LAB — CBC
Hemoglobin: 14.9 g/dL (ref 13.0–17.0)
MCHC: 35.2 g/dL (ref 30.0–36.0)
RBC: 4.55 MIL/uL (ref 4.22–5.81)
RDW: 12.3 % (ref 11.5–15.5)
WBC: 4.3 10*3/uL (ref 4.0–10.5)

## 2013-01-19 LAB — BASIC METABOLIC PANEL
CO2: 25 mEq/L (ref 19–32)
GFR calc Af Amer: >90 mL/min (ref >90)
GFR calc non Af Amer: >90 mL/min (ref >90)
Glucose, Bld: 69 mg/dL — ABNORMAL LOW (ref 70–99)
Potassium: 4.4 mEq/L (ref 3.5–5.1)
Sodium: 138 mEq/L (ref 135–145)

## 2013-01-19 LAB — PROTIME-INR: INR: 0.94 (ref 0.00–1.49)

## 2013-01-19 NOTE — Pre-Procedure Instructions (Signed)
[name]  01/19/2013   Your procedure is scheduled on:  Wednesday, December 17th.  Report to [facility], Main Entrance / Entrance "A" at 11:30AM.  Call this number if you have problems the morning of surgery: [phone]   Remember:   Do not eat food or drink liquids after midnight Tuesday.   Take these medicines the morning of surgery with A SIP OF WATER: Cymbalta Stop taking Aspirin, Coumadin, Plavix, Effient and Herbal medications, Vitamins.  Do not take any NSAIDs ie: Ibuprofen,  Advil,Naproxen or any medication containing Aspirin.   Do not wear jewelry, make-up or nail polish.  Do not wear lotions, powders, or perfumes. You may wear deodorant.   Men may shave face and neck.  Do not bring valuables to the hospital.  [facility] is not responsible  for any belongings or valuables.               Contacts, dentures or bridgework may not be worn into surgery.  Leave suitcase in the car. After surgery it may be brought to your room.  For patients admitted to the hospital, discharge time is determined by your treatment team.               Patients discharged the day of surgery will not be allowed to drive home.  Name and phone number of your driver: -   Special Instructions: Shower using CHG 2 nights before surgery and the night before surgery.  If you shower the day of surgery use CHG.  Use special wash - you have one bottle of CHG for all showers.  You should use approximately 1/3 of the bottle for each shower.   Please read over the following fact sheets that you were given: Pain Booklet, Coughing and Deep Breathing and Surgical Site Infection Prevention

## 2013-01-27 MED ORDER — CEFAZOLIN SODIUM-DEXTROSE 2-3 GM-% IV SOLR
2.0000 g | INTRAVENOUS | Status: AC
  Administered 2013-01-28: 2 g via INTRAVENOUS
  Filled 2013-01-27: qty 50

## 2013-01-27 MED ORDER — DEXAMETHASONE SODIUM PHOSPHATE 10 MG/ML IJ SOLN
10.0000 mg | INTRAMUSCULAR | Status: AC
  Administered 2013-01-28: 10 mg via INTRAVENOUS
  Filled 2013-01-27: qty 1

## 2013-01-28 ENCOUNTER — Encounter (HOSPITAL_COMMUNITY): Payer: BC Managed Care – PPO | Admitting: Critical Care Medicine

## 2013-01-28 ENCOUNTER — Ambulatory Visit (HOSPITAL_COMMUNITY)
Admission: RE | Admit: 2013-01-28 | Discharge: 2013-01-29 | Disposition: A | Discharge status: Home / Self Care | Payer: BC Managed Care – PPO | Source: Ambulatory Visit | Attending: Neurological Surgery | Admitting: Neurological Surgery

## 2013-01-28 ENCOUNTER — Inpatient Hospital Stay (HOSPITAL_COMMUNITY): Payer: BC Managed Care – PPO | Admitting: Critical Care Medicine

## 2013-01-28 ENCOUNTER — Encounter (HOSPITAL_COMMUNITY)
Admission: RE | Disposition: A | Discharge status: Home / Self Care | Payer: Self-pay | Source: Ambulatory Visit | Attending: Neurological Surgery

## 2013-01-28 ENCOUNTER — Inpatient Hospital Stay (HOSPITAL_COMMUNITY): Payer: BC Managed Care – PPO

## 2013-01-28 ENCOUNTER — Encounter (HOSPITAL_COMMUNITY): Payer: Self-pay | Admitting: Critical Care Medicine

## 2013-01-28 DIAGNOSIS — T84498A Other mechanical complication of other internal orthopedic devices, implants and grafts, initial encounter: Principal | ICD-10-CM | POA: Insufficient documentation

## 2013-01-28 DIAGNOSIS — Z7982 Long term (current) use of aspirin: Secondary | ICD-10-CM | POA: Insufficient documentation

## 2013-01-28 DIAGNOSIS — Y832 Surgical operation with anastomosis, bypass or graft as the cause of abnormal reaction of the patient, or of later complication, without mention of misadventure at the time of the procedure: Secondary | ICD-10-CM | POA: Insufficient documentation

## 2013-01-28 DIAGNOSIS — Z87891 Personal history of nicotine dependence: Secondary | ICD-10-CM | POA: Insufficient documentation

## 2013-01-28 DIAGNOSIS — Z981 Arthrodesis status: Secondary | ICD-10-CM | POA: Insufficient documentation

## 2013-01-28 DIAGNOSIS — G4730 Sleep apnea, unspecified: Secondary | ICD-10-CM | POA: Insufficient documentation

## 2013-01-28 HISTORY — PX: POSTERIOR CERVICAL FUSION/FORAMINOTOMY: SHX5038

## 2013-01-28 SURGERY — POSTERIOR CERVICAL FUSION/FORAMINOTOMY LEVEL 2
Anesthesia: General

## 2013-01-28 MED ORDER — THROMBIN 5000 UNITS EX SOLR
CUTANEOUS | Status: DC | PRN
  Administered 2013-01-28 (×2): 5000 [IU] via TOPICAL

## 2013-01-28 MED ORDER — ALBUMIN HUMAN 5 % IV SOLN
INTRAVENOUS | Status: DC | PRN
  Administered 2013-01-28: 14:00:00 via INTRAVENOUS

## 2013-01-28 MED ORDER — SODIUM CHLORIDE 0.9 % IJ SOLN: 3.0000 mL | INTRAMUSCULAR | Status: DC | PRN

## 2013-01-28 MED ORDER — PROPOFOL 10 MG/ML IV BOLUS
INTRAVENOUS | Status: DC | PRN
  Administered 2013-01-28: 200 mg via INTRAVENOUS

## 2013-01-28 MED ORDER — GLYCOPYRROLATE 0.2 MG/ML IJ SOLN
INTRAMUSCULAR | Status: DC | PRN
  Administered 2013-01-28: 0.6 mg via INTRAVENOUS
  Administered 2013-01-28: 0.2 mg via INTRAVENOUS

## 2013-01-28 MED ORDER — MENTHOL 3 MG MT LOZG: 1.0000 | LOZENGE | OROMUCOSAL | Status: DC | PRN

## 2013-01-28 MED ORDER — ZOLPIDEM TARTRATE 5 MG PO TABS
5.0000 mg | ORAL_TABLET | Freq: Every evening | ORAL | Status: DC | PRN
  Administered 2013-01-28: 5 mg via ORAL
  Filled 2013-01-28: qty 1

## 2013-01-28 MED ORDER — HYDROMORPHONE HCL PF 1 MG/ML IJ SOLN
INTRAMUSCULAR | Status: AC
  Filled 2013-01-28: qty 1

## 2013-01-28 MED ORDER — EPHEDRINE SULFATE 50 MG/ML IJ SOLN
INTRAMUSCULAR | Status: DC | PRN
  Administered 2013-01-28: 10 mg via INTRAVENOUS
  Administered 2013-01-28: 5 mg via INTRAVENOUS
  Administered 2013-01-28: 25 mg via INTRAVENOUS
  Administered 2013-01-28: 15 mg via INTRAVENOUS

## 2013-01-28 MED ORDER — ARTIFICIAL TEARS OP OINT
TOPICAL_OINTMENT | OPHTHALMIC | Status: DC | PRN
  Administered 2013-01-28: 1 via OPHTHALMIC

## 2013-01-28 MED ORDER — ACETAMINOPHEN 650 MG RE SUPP: 650.0000 mg | RECTAL | Status: DC | PRN

## 2013-01-28 MED ORDER — KETOROLAC TROMETHAMINE 30 MG/ML IJ SOLN
INTRAMUSCULAR | Status: DC | PRN
  Administered 2013-01-28: 30 mg via INTRAVENOUS

## 2013-01-28 MED ORDER — METHOCARBAMOL 100 MG/ML IJ SOLN
500.0000 mg | Freq: Four times a day (QID) | INTRAMUSCULAR | Status: DC | PRN
  Filled 2013-01-28 (×2): qty 5

## 2013-01-28 MED ORDER — LIDOCAINE HCL (CARDIAC) 20 MG/ML IV SOLN
INTRAVENOUS | Status: DC | PRN
  Administered 2013-01-28: 80 mg via INTRAVENOUS

## 2013-01-28 MED ORDER — 0.9 % SODIUM CHLORIDE (POUR BTL) OPTIME
TOPICAL | Status: DC | PRN
  Administered 2013-01-28: 1000 mL

## 2013-01-28 MED ORDER — ATROPINE SULFATE 1 MG/ML IJ SOLN
INTRAMUSCULAR | Status: DC | PRN
  Administered 2013-01-28: 0.2 mg via INTRAVENOUS

## 2013-01-28 MED ORDER — METHOCARBAMOL 500 MG PO TABS
500.0000 mg | ORAL_TABLET | Freq: Four times a day (QID) | ORAL | Status: DC | PRN
  Administered 2013-01-28 – 2013-01-29 (×2): 500 mg via ORAL
  Filled 2013-01-28 (×3): qty 1

## 2013-01-28 MED ORDER — OXYCODONE-ACETAMINOPHEN 5-325 MG PO TABS: 1.0000 | ORAL_TABLET | ORAL | Status: DC | PRN

## 2013-01-28 MED ORDER — POTASSIUM CHLORIDE IN NACL 20-0.9 MEQ/L-% IV SOLN
INTRAVENOUS | Status: DC
  Filled 2013-01-28 (×3): qty 1000

## 2013-01-28 MED ORDER — SENNA 8.6 MG PO TABS
1.0000 | ORAL_TABLET | Freq: Two times a day (BID) | ORAL | Status: DC
  Administered 2013-01-28 – 2013-01-29 (×2): 8.6 mg via ORAL
  Filled 2013-01-28 (×3): qty 1

## 2013-01-28 MED ORDER — DULOXETINE HCL 60 MG PO CPEP
60.0000 mg | ORAL_CAPSULE | Freq: Every day | ORAL | Status: DC
  Administered 2013-01-29: 60 mg via ORAL
  Filled 2013-01-28 (×2): qty 1

## 2013-01-28 MED ORDER — FENTANYL CITRATE 0.05 MG/ML IJ SOLN
INTRAMUSCULAR | Status: DC | PRN
  Administered 2013-01-28: 100 ug via INTRAVENOUS
  Administered 2013-01-28: 50 ug via INTRAVENOUS

## 2013-01-28 MED ORDER — CELECOXIB 200 MG PO CAPS
200.0000 mg | ORAL_CAPSULE | Freq: Two times a day (BID) | ORAL | Status: DC
  Administered 2013-01-28 – 2013-01-29 (×2): 200 mg via ORAL
  Filled 2013-01-28 (×3): qty 1

## 2013-01-28 MED ORDER — BACITRACIN 50000 UNITS IM SOLR
INTRAMUSCULAR | Status: DC | PRN
  Administered 2013-01-28: 13:00:00

## 2013-01-28 MED ORDER — ROCURONIUM BROMIDE 100 MG/10ML IV SOLN
INTRAVENOUS | Status: DC | PRN
  Administered 2013-01-28: 10 mg via INTRAVENOUS
  Administered 2013-01-28: 50 mg via INTRAVENOUS

## 2013-01-28 MED ORDER — HYDROMORPHONE HCL PF 1 MG/ML IJ SOLN
0.2500 mg | INTRAMUSCULAR | Status: DC | PRN
  Administered 2013-01-28 (×4): 0.5 mg via INTRAVENOUS

## 2013-01-28 MED ORDER — PHENOL 1.4 % MT LIQD: 1.0000 | OROMUCOSAL | Status: DC | PRN

## 2013-01-28 MED ORDER — ONDANSETRON HCL 4 MG/2ML IJ SOLN
INTRAMUSCULAR | Status: DC | PRN
  Administered 2013-01-28: 4 mg via INTRAVENOUS

## 2013-01-28 MED ORDER — BUPIVACAINE HCL (PF) 0.25 % IJ SOLN
INTRAMUSCULAR | Status: DC | PRN
  Administered 2013-01-28: 9 mL
  Administered 2013-01-28: 3 mL

## 2013-01-28 MED ORDER — ACETAMINOPHEN 325 MG PO TABS: 650.0000 mg | ORAL_TABLET | ORAL | Status: DC | PRN

## 2013-01-28 MED ORDER — LACTATED RINGERS IV SOLN
INTRAVENOUS | Status: DC
  Administered 2013-01-28 (×2): via INTRAVENOUS

## 2013-01-28 MED ORDER — ONDANSETRON HCL 4 MG/2ML IJ SOLN: 4.0000 mg | INTRAMUSCULAR | Status: DC | PRN

## 2013-01-28 MED ORDER — SODIUM CHLORIDE 0.9 % IJ SOLN
3.0000 mL | Freq: Two times a day (BID) | INTRAMUSCULAR | Status: DC
  Administered 2013-01-28 – 2013-01-29 (×2): 3 mL via INTRAVENOUS

## 2013-01-28 MED ORDER — CEFAZOLIN SODIUM 1-5 GM-% IV SOLN
1.0000 g | Freq: Three times a day (TID) | INTRAVENOUS | Status: AC
  Administered 2013-01-28 (×2): 1 g via INTRAVENOUS
  Filled 2013-01-28 (×2): qty 50

## 2013-01-28 MED ORDER — NEOSTIGMINE METHYLSULFATE 1 MG/ML IJ SOLN
INTRAMUSCULAR | Status: DC | PRN
  Administered 2013-01-28: 4 mg via INTRAVENOUS

## 2013-01-28 MED ORDER — HEMOSTATIC AGENTS (NO CHARGE) OPTIME
TOPICAL | Status: DC | PRN
  Administered 2013-01-28: 1 via TOPICAL

## 2013-01-28 MED ORDER — MORPHINE SULFATE 2 MG/ML IJ SOLN: 1.0000 mg | INTRAMUSCULAR | Status: DC | PRN

## 2013-01-28 MED ORDER — MIDAZOLAM HCL 5 MG/5ML IJ SOLN
INTRAMUSCULAR | Status: DC | PRN
  Administered 2013-01-28: 2 mg via INTRAVENOUS

## 2013-01-28 MED ORDER — SODIUM CHLORIDE 0.9 % IV SOLN: 250.0000 mL | INTRAVENOUS | Status: DC

## 2013-01-28 SURGICAL SUPPLY — 62 items
APPLICATOR CHLORAPREP 3ML ORNG (MISCELLANEOUS) ×2 IMPLANT
BAG DECANTER FOR FLEXI CONT (MISCELLANEOUS) ×2 IMPLANT
BENZOIN TINCTURE PRP APPL 2/3 (GAUZE/BANDAGES/DRESSINGS) ×2 IMPLANT
BIT DRILL VUEPOINT II (BIT) ×1 IMPLANT
BLADE SURG ROTATE 9660 (MISCELLANEOUS) IMPLANT
BUR MATCHSTICK NEURO 3.0 LAGG (BURR) ×2 IMPLANT
CANISTER SUCT 3000ML (MISCELLANEOUS) ×2 IMPLANT
CHLORAPREP W/TINT 26ML (MISCELLANEOUS) ×2 IMPLANT
CONT SPEC 4OZ CLIKSEAL STRL BL (MISCELLANEOUS) ×2 IMPLANT
DRAPE C-ARM 42X72 X-RAY (DRAPES) ×4 IMPLANT
DRAPE INCISE 23X17 IOBAN STRL (DRAPES) ×1
DRAPE INCISE IOBAN 23X17 STRL (DRAPES) ×1 IMPLANT
DRAPE LAPAROTOMY 100X72 PEDS (DRAPES) ×2 IMPLANT
DRAPE POUCH INSTRU U-SHP 10X18 (DRAPES) ×2 IMPLANT
DRESSING TELFA 8X3 (GAUZE/BANDAGES/DRESSINGS) ×2 IMPLANT
DRILL BIT VUEPOINT II (BIT) ×1
DRSG OPSITE 4X5.5 SM (GAUZE/BANDAGES/DRESSINGS) ×2 IMPLANT
DURAPREP 6ML APPLICATOR 50/CS (WOUND CARE) IMPLANT
ELECT REM PT RETURN 9FT ADLT (ELECTROSURGICAL) ×2
ELECTRODE REM PT RTRN 9FT ADLT (ELECTROSURGICAL) ×1 IMPLANT
EVACUATOR 1/8 PVC DRAIN (DRAIN) IMPLANT
GAUZE SPONGE 4X4 16PLY XRAY LF (GAUZE/BANDAGES/DRESSINGS) IMPLANT
GLOVE BIO SURGEON STRL SZ8 (GLOVE) ×2 IMPLANT
GLOVE BIOGEL PI IND STRL 7.5 (GLOVE) ×2 IMPLANT
GLOVE BIOGEL PI INDICATOR 7.5 (GLOVE) ×2
GLOVE EXAM NITRILE LRG STRL (GLOVE) IMPLANT
GLOVE EXAM NITRILE MD LF STRL (GLOVE) IMPLANT
GLOVE EXAM NITRILE XL STR (GLOVE) IMPLANT
GLOVE EXAM NITRILE XS STR PU (GLOVE) IMPLANT
GLOVE OPTIFIT SS 6.5 STRL BRWN (GLOVE) ×6 IMPLANT
GOWN BRE IMP SLV AUR LG STRL (GOWN DISPOSABLE) IMPLANT
GOWN BRE IMP SLV AUR XL STRL (GOWN DISPOSABLE) ×2 IMPLANT
GOWN STRL REIN 2XL LVL4 (GOWN DISPOSABLE) IMPLANT
GRAFT BN 5X1XSPNE CVD POST DBM (Bone Implant) ×1 IMPLANT
GRAFT BONE MAGNIFUSE 1X5CM (Bone Implant) ×1 IMPLANT
HEMOSTAT POWDER KIT SURGIFOAM (HEMOSTASIS) IMPLANT
KIT BASIN OR (CUSTOM PROCEDURE TRAY) ×2 IMPLANT
KIT ROOM TURNOVER OR (KITS) ×2 IMPLANT
MARKER SKIN DUAL TIP RULER LAB (MISCELLANEOUS) ×2 IMPLANT
NEEDLE HYPO 18GX1.5 BLUNT FILL (NEEDLE) IMPLANT
NEEDLE HYPO 25X1 1.5 SAFETY (NEEDLE) ×2 IMPLANT
NEEDLE SPNL 20GX3.5 QUINCKE YW (NEEDLE) ×2 IMPLANT
NS IRRIG 1000ML POUR BTL (IV SOLUTION) ×2 IMPLANT
PACK LAMINECTOMY NEURO (CUSTOM PROCEDURE TRAY) ×2 IMPLANT
PIN MAYFIELD SKULL DISP (PIN) ×2 IMPLANT
ROD VUEPOINT 3.5X60 (Rod) ×2 IMPLANT
SCREW MA MM 3.5X12 (Screw) ×8 IMPLANT
SCREW SET THREADED (Screw) ×8 IMPLANT
SPONGE GAUZE 4X4 12PLY (GAUZE/BANDAGES/DRESSINGS) ×2 IMPLANT
SPONGE SURGIFOAM ABS GEL SZ50 (HEMOSTASIS) ×2 IMPLANT
STRIP CLOSURE SKIN 1/2X4 (GAUZE/BANDAGES/DRESSINGS) ×2 IMPLANT
SUT VIC AB 0 CT1 18XCR BRD8 (SUTURE) ×1 IMPLANT
SUT VIC AB 0 CT1 8-18 (SUTURE) ×1
SUT VIC AB 2-0 CP2 18 (SUTURE) ×2 IMPLANT
SUT VIC AB 3-0 SH 8-18 (SUTURE) ×2 IMPLANT
SYR 20ML ECCENTRIC (SYRINGE) ×2 IMPLANT
SYR 3ML LL SCALE MARK (SYRINGE) IMPLANT
TOWEL OR 17X24 6PK STRL BLUE (TOWEL DISPOSABLE) ×2 IMPLANT
TOWEL OR 17X26 10 PK STRL BLUE (TOWEL DISPOSABLE) ×2 IMPLANT
TRAY FOLEY CATH 14FRSI W/METER (CATHETERS) IMPLANT
UNDERPAD 30X30 INCONTINENT (UNDERPADS AND DIAPERS) ×2 IMPLANT
WATER STERILE IRR 1000ML POUR (IV SOLUTION) ×2 IMPLANT

## 2013-01-28 NOTE — H&P (Signed)
Subjective:   Patient is a 62 y.o. male admitted for PCF C6-T1. The patient first presented to me with complaints of neck pain. Onset of symptoms was a few months ago. The pain is described as aching and occurs intermittently. The pain is rated severe, and is located  base of the neck and radiates to the arm. The symptoms have been progressive. Symptoms are exacerbated by extending head backwards, and are relieved by none.  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 Hyperlipidemia   . Degenerative disk disease   . Spinal stenosis of lumbar region   . Depression   . Arthritis   . Sleep apnea     uses  C pap   . Cancer     basal cell CA- removed from back  . Cataract   . Complication of anesthesia     difficullty voiding  . Headache(784.0)     no longer since cerviacl disc surgery  . Hemorrhoids   . Balance disorder     post lumbar surgery.    Past Surgical History  Procedure Laterality Date  . Lumbar disc surgery    . Thoracic vertebral decompression    . Appendectomy    . Knee arthroscopy      x 2, bilateral  . Foot fusion      Right great toe  . Cataract extraction      Left  . Nasal septum surgery    . Lumbar laminectomy/decompression microdiscectomy  04/04/2011    Procedure: LUMBAR LAMINECTOMY/DECOMPRESSION MICRODISCECTOMY 2 LEVELS;  Surgeon: Tia Alert, MD;  Location: [facility];  Service: Neurosurgery;  Laterality: N/A;  lumbar laminectomy lumbar three-four, lumbar four-five  . Cervical disectomy  05/09/2011  . Skin cancer excision      2007    Allergies  Allergen Reactions  . Betadine [Povidone Iodine] Rash    Itching and hot, burning    History  Substance Use Topics  . Smoking status: Former Smoker -- 8 years    Types: Cigarettes    Quit date: 03/21/1981  . Smokeless tobacco: Never Used  . Alcohol Use: 0.0 oz/week    1-2 Glasses of wine per week   Comment: daily glass of wine    Family History  Problem Relation Age of Onset  . Heart disease    . Cancer Mother   . Sleep apnea Brother   . Heart attack Brother   . Alcohol abuse Brother   . Heart attack Father   . Diabetes Father   . Anesthesia problems Neg Hx   . Colon cancer Neg Hx   . Esophageal cancer Neg Hx   . Rectal cancer Neg Hx   . Stomach cancer Neg Hx    Prior to Admission medications   Medication Sig Start Date End Date Taking? Authorizing Provider  aspirin 81 MG tablet Take 81 mg by mouth daily.   Yes Historical Provider, MD  atorvastatin (LIPITOR) 40 MG tablet Take 40 mg by mouth daily.  01/15/11  Yes Historical Provider, MD  DULoxetine (CYMBALTA) 60 MG capsule Take 60 mg by mouth daily.   Yes Historical Provider, MD  Multiple Vitamin (MULITIVITAMIN WITH MINERALS) TABS Take 1 tablet by mouth daily.   Yes Historical Provider, MD  naproxen sodium (ANAPROX) 220 MG tablet Take 440 mg by mouth 2 (two) times daily with a meal.   Yes Historical Provider, MD  Omega-3 Fatty  Acids (FISH OIL PO) Take 1 capsule by mouth daily.   Yes Historical Provider, MD  Testosterone 40.5 MG/2.5GM (1.62%) GEL Place 1 application onto the skin daily. Applies 4 pumps on skin once a day   Yes Historical Provider, MD  vitamin B-12 (CYANOCOBALAMIN) 1000 MCG tablet Take 2,500 mcg by mouth daily.    Yes Historical Provider, MD  zolpidem (AMBIEN CR) 12.5 MG CR tablet Take 12.5 mg by mouth at bedtime as needed for sleep.    Historical Provider, MD     Review of Systems  Positive ROS: neg  All other systems have been reviewed and were otherwise negative with the exception of those mentioned in the HPI and as above.  Objective: Vital signs in last 24 hours: Temp:  [97.9 F (36.6 C)] 97.9 F (36.6 C) (12/17 1106) Pulse Rate:  [49] 49 (12/17 1106) Resp:  [18] 18 (12/17 1106) BP: (122)/(62) 122/62 mmHg (12/17 1106) SpO2:  [100 %] 100 % (12/17 1106)  General Appearance: Alert, cooperative, no  distress, appears stated age Head: Normocephalic, without obvious abnormality, atraumatic Eyes: PERRL, conjunctiva/corneas clear, EOM's intact      Neck: Supple, symmetrical, trachea midline, Back: Symmetric, no curvature, ROM normal, no CVA tenderness Lungs:  respirations unlabored Heart: Regular rate and rhythm Abdomen: Soft, non-tender Extremities: Extremities normal, atraumatic, no cyanosis or edema Pulses: 2+ and symmetric all extremities Skin: Skin color, texture, turgor normal, no rashes or lesions  NEUROLOGIC:  Mental status: Alert and oriented x4, no aphasia, good attention span, fund of knowledge and memory  Motor Exam - grossly normal Sensory Exam - grossly normal Reflexes: 1+ Coordination - grossly normal Gait - grossly normal Balance - grossly normal Cranial Nerves: I: smell Not tested  II: visual acuity  OS: nl    OD: nl  II: visual fields Full to confrontation  II: pupils Equal, round, reactive to light  III,VII: ptosis None  III,IV,VI: extraocular muscles  Full ROM  V: mastication Normal  V: facial light touch sensation  Normal  V,VII: corneal reflex  Present  VII: facial muscle function - upper  Normal  VII: facial muscle function - lower Normal  VIII: hearing Not tested  IX: soft palate elevation  Normal  IX,X: gag reflex Present  XI: trapezius strength  5/5  XI: sternocleidomastoid strength 5/5  XI: neck flexion strength  5/5  XII: tongue strength  Normal    Data Review Lab Results  Component Value Date   WBC 4.3 01/19/2013   HGB 14.9 01/19/2013   HCT 42.3 01/19/2013   MCV 93.0 01/19/2013   PLT 176 01/19/2013   Lab Results  Component Value Date   NA 138 01/19/2013   K 4.4 01/19/2013   CL 104 01/19/2013   CO2 25 01/19/2013   BUN 19 01/19/2013   CREATININE 0.84 01/19/2013   GLUCOSE 69* 01/19/2013   Lab Results  Component Value Date   INR 0.94 01/19/2013    Assessment:   Cervical neck pain with herniated nucleus pulposus/ spondylosis/ stenosis at  C7-T1 with 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 Patient has failed conservative therapy. Planned surgery : Posterior cervical fusion C6-T1  Plan:   I explained the condition and procedure to the patient and answered any questions.  Patient wishes to proceed with procedure as planned. Understands risks/ benefits/ and expected or typical outcomes.  Kseniya Grunden S 01/28/2013 12:56 PM 0. 0. 0 .

## 2013-01-28 NOTE — Anesthesia Preprocedure Evaluation (Addendum)
Anesthesia Evaluation  Patient identified by MRN, date of birth, ID band Patient awake    Reviewed: Allergy & Precautions, H&P , NPO status , Patient's Chart, lab work & pertinent test results  History of Anesthesia Complications (+) history of anesthetic complications  Airway Mallampati: II      Dental  (+) Dental Advisory Given   Pulmonary sleep apnea , former smoker,  breath sounds clear to auscultation        Cardiovascular negative cardio ROS  Rhythm:Regular Rate:Normal     Neuro/Psych  Headaches, Depression    GI/Hepatic negative GI ROS, Neg liver ROS,   Endo/Other  negative endocrine ROS  Renal/GU negative Renal ROS     Musculoskeletal  (+) Arthritis -,   Abdominal   Peds  Hematology   Anesthesia Other Findings   Reproductive/Obstetrics                          Anesthesia Physical Anesthesia Plan  ASA: III  Anesthesia Plan: General   Post-op Pain Management:    Induction: Intravenous  Airway Management Planned: Oral ETT  Additional Equipment:   Intra-op Plan:   Post-operative Plan: Extubation in OR  Informed Consent: I have reviewed the patients History and Physical, chart, labs and discussed the procedure including the risks, benefits and alternatives for the proposed anesthesia with the patient or authorized representative who has indicated his/her understanding and acceptance.   Dental advisory given  Plan Discussed with: Anesthesiologist, Surgeon and CRNA  Anesthesia Plan Comments:        Anesthesia Quick Evaluation

## 2013-01-28 NOTE — Preoperative (Signed)
Beta Blockers   Reason not to administer Beta Blockers:Not Applicable 

## 2013-01-28 NOTE — Transfer of Care (Signed)
Immediate Anesthesia Transfer of Care Note  Patient: [name]  Procedure(s) Performed: Procedure(s): POSTERIOR CERVICAL FUSION/FORAMINOTOMY CERVICAL SIX-SEVEN (N/A)  Patient Location: PACU  Anesthesia Type:General  Level of Consciousness: awake, alert  and oriented  Airway & Oxygen Therapy: Patient Spontanous Breathing and Patient connected to nasal cannula oxygen  Post-op Assessment: Report given to PACU RN, Post -op Vital signs reviewed and stable and Patient moving all extremities X 4  Post vital signs: Reviewed and stable  Complications: No apparent anesthesia complications

## 2013-01-28 NOTE — Anesthesia Postprocedure Evaluation (Signed)
  Anesthesia Post-op Note  Patient: [name]  Procedure(s) Performed: Procedure(s): POSTERIOR CERVICAL FUSION/FORAMINOTOMY CERVICAL SIX-SEVEN (N/A)  Patient Location: PACU  Anesthesia Type:General  Level of Consciousness: awake  Airway and Oxygen Therapy: Patient Spontanous Breathing  Post-op Pain: mild  Post-op Assessment: Post-op Vital signs reviewed  Post-op Vital Signs: Reviewed  Complications: No apparent anesthesia complications

## 2013-01-28 NOTE — Anesthesia Postprocedure Evaluation (Signed)
  Anesthesia Post-op Note  Patient: [name]  Procedure(s) Performed: Procedure(s): POSTERIOR CERVICAL FUSION/FORAMINOTOMY CERVICAL SIX-SEVEN (N/A)  Patient Location: PACU  Anesthesia Type:General  Level of Consciousness: awake  Airway and Oxygen Therapy: Patient Spontanous Breathing  Post-op Pain: mild  Post-op Assessment: Post-op Vital signs reviewed  Post-op Vital Signs: Reviewed  Complications: No apparent anesthesia complications 

## 2013-01-28 NOTE — Op Note (Signed)
01/28/2013  3:08 PM  PATIENT:  [name]  62 y.o. 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 CT scan showed solid fusions at C3-4 C4-5 and C5-6 with pseudoarthrosis at C6-7 with facet arthrosis the C7-T1. He tried medical management without relief. I recommended a posterior cervical fusion.  Patient understood the risks, benefits, and alternatives and potential outcomes and wished to proceed.  PROCEDURE DETAILS: The patient was brought to the operating room. Generalized endotracheal anesthesia was induced. The patient was affixed a 3 point [place] headrest and rolled into the prone position on chest rolls. All pressure points were padded. The posterior cervical region was cleaned and prepped with DuraPrep and then draped in the usual sterile fashion. 7 cc of local anesthesia was injected and a dorsal midline incision made in the posterior cervical region and carried down to the cervical fascia. The fascia was opened and the paraspinous musculature was taken down to expose C6-7 and C7-T1. Intraoperative fluoroscopy confirmed my level and then the dissection was carried out over the lateral facets. I localized the midpoint of each lateral mass and marked a region 1 mm medial to the midpoint of the lateral mass, and then drilled in an upward and outward direction into the safe zone of each lateral mass at C6-7.  I drilled to a depth of 12 mm and then checked my drill hole with a ball probe. I then placed a 12 mm lateral mass screws into the safe zone of each lateral mass until they were 2 fingers tight.  I then decorticated the lateral masses and the facet joints and packed them with morcellized allograft to perform arthrodesis from C6-7 and C7-T1. I then placed rods into the multiaxial screw heads of the screws and locked these into position with the locking caps and anti-torque device. I then checked the final construct with AP/Lat fluoroscopy. I irrigated with saline solution containing bacitracin. I placed a medium Hemovac drain through separate stab incision, and lined the dura with Gelfoam. After hemostasis was achieved I closed the muscle and the fascia with 0 Vicryl, subcutaneous tissue with 2-0 Vicryl, and the subcuticular tissue with 3-0 Vicryl. The skin was closed with benzoin and Steri-Strips. A sterile dressing was applied, the patient was turned to the supine position and taken out of the headrest, awakened from general anesthesia and transferred to the recovery room in stable condition. At the end of the procedure all sponge, needle and instrument counts were correct.   PLAN OF CARE: Admit for overnight observation  PATIENT DISPOSITION:  PACU - hemodynamically stable.   Delay start of Pharmacological VTE agent (>24hrs) due to surgical blood loss or risk of bleeding:  yes

## 2013-01-29 ENCOUNTER — Encounter (HOSPITAL_COMMUNITY): Payer: Self-pay | Admitting: Neurological Surgery

## 2013-01-29 MED ORDER — OXYCODONE-ACETAMINOPHEN 5-325 MG PO TABS: 1.0000 | ORAL_TABLET | ORAL | Status: DC | PRN

## 2013-01-29 MED ORDER — METHOCARBAMOL 500 MG PO TABS: 500.0000 mg | ORAL_TABLET | Freq: Four times a day (QID) | ORAL | Status: DC | PRN

## 2013-01-29 NOTE — Progress Notes (Signed)
Pt. Alert and oriented, follows simple instructions, denies pain. Incision area without swelling, redness or S/S of infection. Voiding adequate clear yellow urine. Moving all extremities well and vitals stable and documented. Patient discharged home with spouse. Anterior Cervical Fusion surgery notes instructions given to patient and family member for home safety and precautions. Pt. and family stated understanding of instructions given.  

## 2013-01-29 NOTE — Progress Notes (Signed)
PT. UP TO BATHROOM SEVERAL TIMES AND UNABLE TO VOID AND C/O DISCOMFORT. BLADDER SCAN AT 2020 SHOWED 594 CC. IN & OUT CATH WAS DONE AT 2040 AND 700 CC CLEAR YELLOW URINE WAS OBTAINED. PT. TOLERATED. WELL.    CHRIS Caidin Heidenreich RN

## 2013-01-29 NOTE — Progress Notes (Signed)
CPAP machine set up at bedside via patients home [facility], auto titrate settings per patient request.  Patient states he is able to place on /off as needed.  Patient encouraged to call RT if needed. RN aware.

## 2013-01-29 NOTE — Discharge Summary (Signed)
Physician Discharge Summary  Patient ID: [name] MRN: [id number] DOB/AGE: May 15, 1950 62 y.o.  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 The patient tolerated the procedure well and was taken to the recovery room and then to the floor in stable condition. The hospital course was routine. There were no complications. The wound remained clean dry and intact. Pt had appropriate neck soreness. No complaints of arm pain or new N/T/W. The patient remained afebrile with stable vital signs, and tolerated a regular diet. The patient continued to increase activities, and pain was well controlled with oral pain medications.   Consults: None  Significant Diagnostic Studies:  Results for orders placed during the hospital encounter of 01/19/13  SURGICAL PCR SCREEN      Result Value Range   MRSA, PCR NEGATIVE  NEGATIVE   Staphylococcus aureus NEGATIVE  NEGATIVE  CBC      Result Value Range   WBC 4.3  4.0 - 10.5 K/uL   RBC 4.55  4.22 - 5.81 MIL/uL   Hemoglobin 14.9  13.0 - 17.0 g/dL   HCT 40.9  81.1 - 91.4 %   MCV 93.0  78.0 - 100.0 fL   MCH 32.7  26.0 - 34.0 pg   MCHC 35.2  30.0 - 36.0 g/dL   RDW 78.2  95.6 - 21.3 %   Platelets 176  150 - 400 K/uL  BASIC METABOLIC PANEL      Result Value Range   Sodium 138  135 - 145 mEq/L   Potassium 4.4  3.5 - 5.1 mEq/L   Chloride 104  96 - 112 mEq/L   CO2 25  19 - 32 mEq/L   Glucose, Bld 69 (*) 70 - 99 mg/dL   BUN 19  6 - 23 mg/dL   Creatinine, Ser 0.86  0.50 - 1.35 mg/dL   Calcium 9.3  8.4 - 57.8 mg/dL   GFR calc non Af Amer >90  >90 mL/min   GFR calc Af Amer >90  >90 mL/min  PROTIME-INR      Result Value Range   Prothrombin Time 12.4  11.6 - 15.2 seconds   INR 0.94  0.00 - 1.49     Dg Chest 2 View  01/19/2013   CLINICAL DATA:  Cervical disc  EXAM: CHEST  2 VIEW  COMPARISON:  03/22/2011  FINDINGS: Heart size is normal. Negative for heart failure. Lungs are clear without infiltrate effusion or mass.  ACDF in the lower cervical spine. Thoracic fusion with pedicle screws and posterior rods are unchanged.  IMPRESSION: No active cardiopulmonary abnormality.   Electronically Signed   By: Marlan Palau M.D.   On: 01/19/2013 11:39   Dg Cervical Spine 2-3 Views  01/28/2013   CLINICAL DATA:  Posterior C6-7 fusion.  EXAM: CERVICAL SPINE - 2-3 VIEW; DG C-ARM 1-60 MIN  COMPARISON:  Cervical spine myelogram CT 09/19/2012.  FINDINGS: Four spot fluoroscopic images centered at the cervical thoracic junction demonstrated preexisting anterior plate and screws extending from C3 through C7. There are new bilateral pedicle screws and interconnecting rods at the C6 and C7 levels. No complications are identified  IMPRESSION: Intraoperative views following revision of lower cervical fusion with new C6-7 pedicle screws bilaterally. No demonstrated complication.   Electronically Signed   By: Roxy Horseman  M.D.   On: 01/28/2013 16:49   Dg C-arm 1-60 Min  01/28/2013   CLINICAL DATA:  Posterior C6-7 fusion.  EXAM: CERVICAL SPINE - 2-3 VIEW; DG C-ARM 1-60 MIN  COMPARISON:  Cervical spine myelogram CT 09/19/2012.  FINDINGS: Four spot fluoroscopic images centered at the cervical thoracic junction demonstrated preexisting anterior plate and screws extending from C3 through C7. There are new bilateral pedicle screws and interconnecting rods at the C6 and C7 levels. No complications are identified  IMPRESSION: Intraoperative views following revision of lower cervical fusion with new C6-7 pedicle screws bilaterally. No demonstrated complication.   Electronically Signed   By: Roxy Horseman M.D.   On: 01/28/2013 16:49    Antibiotics:  Anti-infectives   Start     Dose/Rate Route Frequency Ordered Stop   01/28/13  1615  ceFAZolin (ANCEF) IVPB 1 g/50 mL premix     1 g 100 mL/hr over 30 Minutes Intravenous Every 8 hours 01/28/13 1610 01/29/13 0008   01/28/13 1310  bacitracin 50,000 Units in sodium chloride irrigation 0.9 % 500 mL irrigation  Status:  Discontinued       As needed 01/28/13 1401 01/28/13 1448   01/28/13 0600  ceFAZolin (ANCEF) IVPB 2 g/50 mL premix     2 g 100 mL/hr over 30 Minutes Intravenous On call to O.R. 01/27/13 1429 01/28/13 1326      Discharge Exam: Blood pressure 135/68, pulse 45, temperature 97.8 F (36.6 C), temperature source Oral, resp. rate 18, SpO2 97.00%. Neurologic: Grossly normal stable mild myelopathy Incision clean dry and intact  Discharge Medications:     Medication List         aspirin 81 MG tablet  Take 81 mg by mouth daily.     atorvastatin 40 MG tablet  Commonly known as:  LIPITOR  Take 40 mg by mouth daily.     DULoxetine 60 MG capsule  Commonly known as:  CYMBALTA  Take 60 mg by mouth daily.     FISH OIL PO  Take 1 capsule by mouth daily.     methocarbamol 500 MG tablet  Commonly known as:  ROBAXIN  Take 1 tablet (500 mg total) by mouth every 6 (six) hours as needed for muscle spasms.     multivitamin with minerals Tabs tablet  Take 1 tablet by mouth daily.     naproxen sodium 220 MG tablet  Commonly known as:  ANAPROX  Take 440 mg by mouth 2 (two) times daily with a meal.     oxyCODONE-acetaminophen 5-325 MG per tablet  Commonly known as:  PERCOCET/ROXICET  Take 1-2 tablets by mouth every 4 (four) hours as needed for moderate pain.     Testosterone 40.5 MG/2.5GM (1.62%) Gel  Place 1 application onto the skin daily. Applies 4 pumps on skin once a day     vitamin B-12 1000 MCG tablet  Commonly known as:  CYANOCOBALAMIN  Take 2,500 mcg by mouth daily.     zolpidem 12.5 MG CR tablet  Commonly known as:  AMBIEN CR  Take 12.5 mg by mouth at bedtime as needed for sleep.        Disposition: Home   Final Dx: Posterior cervical  fusion      Discharge Orders   Future Appointments Provider Department Dept Phone   02/24/2013 9:00 AM Waymon Budge, MD [facility] [phone]   Future Orders Complete By Expires   Call MD for:  difficulty breathing, headache or visual disturbances  As  directed    Call MD for:  persistant nausea and vomiting  As directed    Call MD for:  redness, tenderness, or signs of infection (pain, swelling, redness, odor or green/yellow discharge around incision site)  As directed    Call MD for:  severe uncontrolled pain  As directed    Call MD for:  temperature >100.4  As directed    Diet - low sodium heart healthy  As directed    Discharge instructions  As directed    Comments:     No heavy lifting and no driving, may shower, may be up and around as tolerated   Increase activity slowly  As directed    Remove dressing in 48 hours  As directed       Follow-up Information   Follow up with Rebeca Valdivia S, MD. Schedule an appointment as soon as possible for a visit in 2 days.   Specialty:  Neurosurgery   Contact information:   [address] [place] [address] [phone]        Signed: Tia Alert 01/29/2013, 9:58 AM

## 2013-01-29 NOTE — Progress Notes (Signed)
PT UP TO BATHROOM AND VOIDING 150 CC WITHOUT DIFFICULTIES.  CHRIS Masyn Fullam RN

## 2013-02-24 ENCOUNTER — Ambulatory Visit (INDEPENDENT_AMBULATORY_CARE_PROVIDER_SITE_OTHER): Payer: BC Managed Care – PPO | Admitting: Internal Medicine

## 2013-02-24 ENCOUNTER — Encounter: Payer: Self-pay | Admitting: Internal Medicine

## 2013-02-24 VITALS — BP 120/60 | HR 63 | Ht 75.0 in | Wt 204.2 lb

## 2013-02-24 DIAGNOSIS — G4733 Obstructive sleep apnea (adult) (pediatric): Secondary | ICD-10-CM

## 2013-02-24 NOTE — Patient Instructions (Addendum)
Ok to continue VPAP 16/ [facility]  Please call as needed  Ok to try otc Nasacort nasal spray    1-2 puffs each nostril once daily at bedtime

## 2013-02-24 NOTE — Progress Notes (Signed)
02/16/11- 60 yoM former smoker, followed for OSA, complicated by arthritis in spine. LOV-02/16/2010 Continues using VPAP all night, every night. He sets his own pressure and brings his own download report which is filed. Compliance has been quite good. Pressure is around 14 with AHI 6.7 per hour. Had flu vaccine.  02/21/12-60 yoM former smoker, followed for OSA, complicated by arthritis in spine. FOLLOWS FOR: wear VPAP/ ResMed every night for about 8 hours and adjusts pressure as needed. We reviewed his printouts and I explained the terms. He had to leave it off for a while after cervical spine surgery in March of 2013. Since then he has tried increasing pressure from 14.6-15.6. This resulted in a little more leak. He snores through a little at times but says he sleeps fine and feels rested CXR 03/22/11 IMPRESSION:  No acute disease.  Original Report Authenticated By: Arvid Right. D'ALESSIO, M.D.  02/24/13- 43 yoM former smoker, followed for OSA, complicated by arthritis in spine. FOLLOWS FOR: wears VPAP 16 for about 8 hours nightly;DME is [facility]. No new supplies needed at this time. Adjusts own pressure. Has had latest back surgery without complication. He has increased his own VPAP to 16/[facility]. We discussed maintenance and control, and replacement of his machine mucus old.  ROS-see HPI Constitutional:   No-   weight loss, night sweats, fevers, chills, fatigue, lassitude. HEENT:   No-  headaches, difficulty swallowing, tooth/dental problems, sore throat,       No-  sneezing, itching, ear ache, nasal congestion, post nasal drip,  CV:  No-   chest pain, orthopnea, PND, swelling in lower extremities, anasarca, dizziness, palpitations Resp: No-   shortness of breath with exertion or at rest.              No-   productive cough,  No non-productive cough,  No- coughing up of blood.              No-   change in color of mucus.  No- wheezing.   Skin: No-   rash or lesions. GI:  No-   heartburn,  indigestion, abdominal pain, nausea, vomiting,  GU:. MS:  No-   joint pain or swelling.  + Chronic back pain. Neuro-     nothing unusual Psych:  No- change in mood or affect. No depression or anxiety.  No memory loss.  OBJ General- Alert, Oriented, Affect-appropriate, Distress- none acute, tall, trim, comfortable appearing. Skin- rash-none, lesions- none, excoriation- none Lymphadenopathy- none Head- atraumatic            Eyes- Gross vision intact, PERRLA, conjunctivae clear secretions            Ears- Hearing, canals-normal            Nose- Clear, no-Septal dev, mucus, polyps, erosion, perforation             Throat- Mallampati II-III , mucosa clear , drainage- none, tonsils- atrophic Neck- flexible , trachea midline, no stridor , thyroid nl, carotid no bruit Chest - symmetrical excursion , unlabored           Heart/CV- RRR , no murmur , no gallop  , no rub, nl s1 s2                           - JVD- none , edema- none, stasis changes- none, varices- none           Lung- clear to P&A, wheeze- none, cough-  none , dullness-none, rub- none           Chest wall-  Abd- Br/ Gen/ Rectal- Not done, not indicated Extrem- cyanosis- none, clubbing, none, atrophy- none, strength- nl. Moves stiffly Neuro- grossly intact to observation

## 2013-03-21 NOTE — Assessment & Plan Note (Signed)
He is comfortable and compliant with VPAP 16/ [facility]

## 2013-08-30 IMAGING — CR DG LUMBAR SPINE 1V
1 series · 1 of 1 positions shown · non-contrast
Comparison: None.

CLINICAL DATA: L3-L5 laminectomy

LUMBAR SPINE - 1 VIEW

[view not recorded]
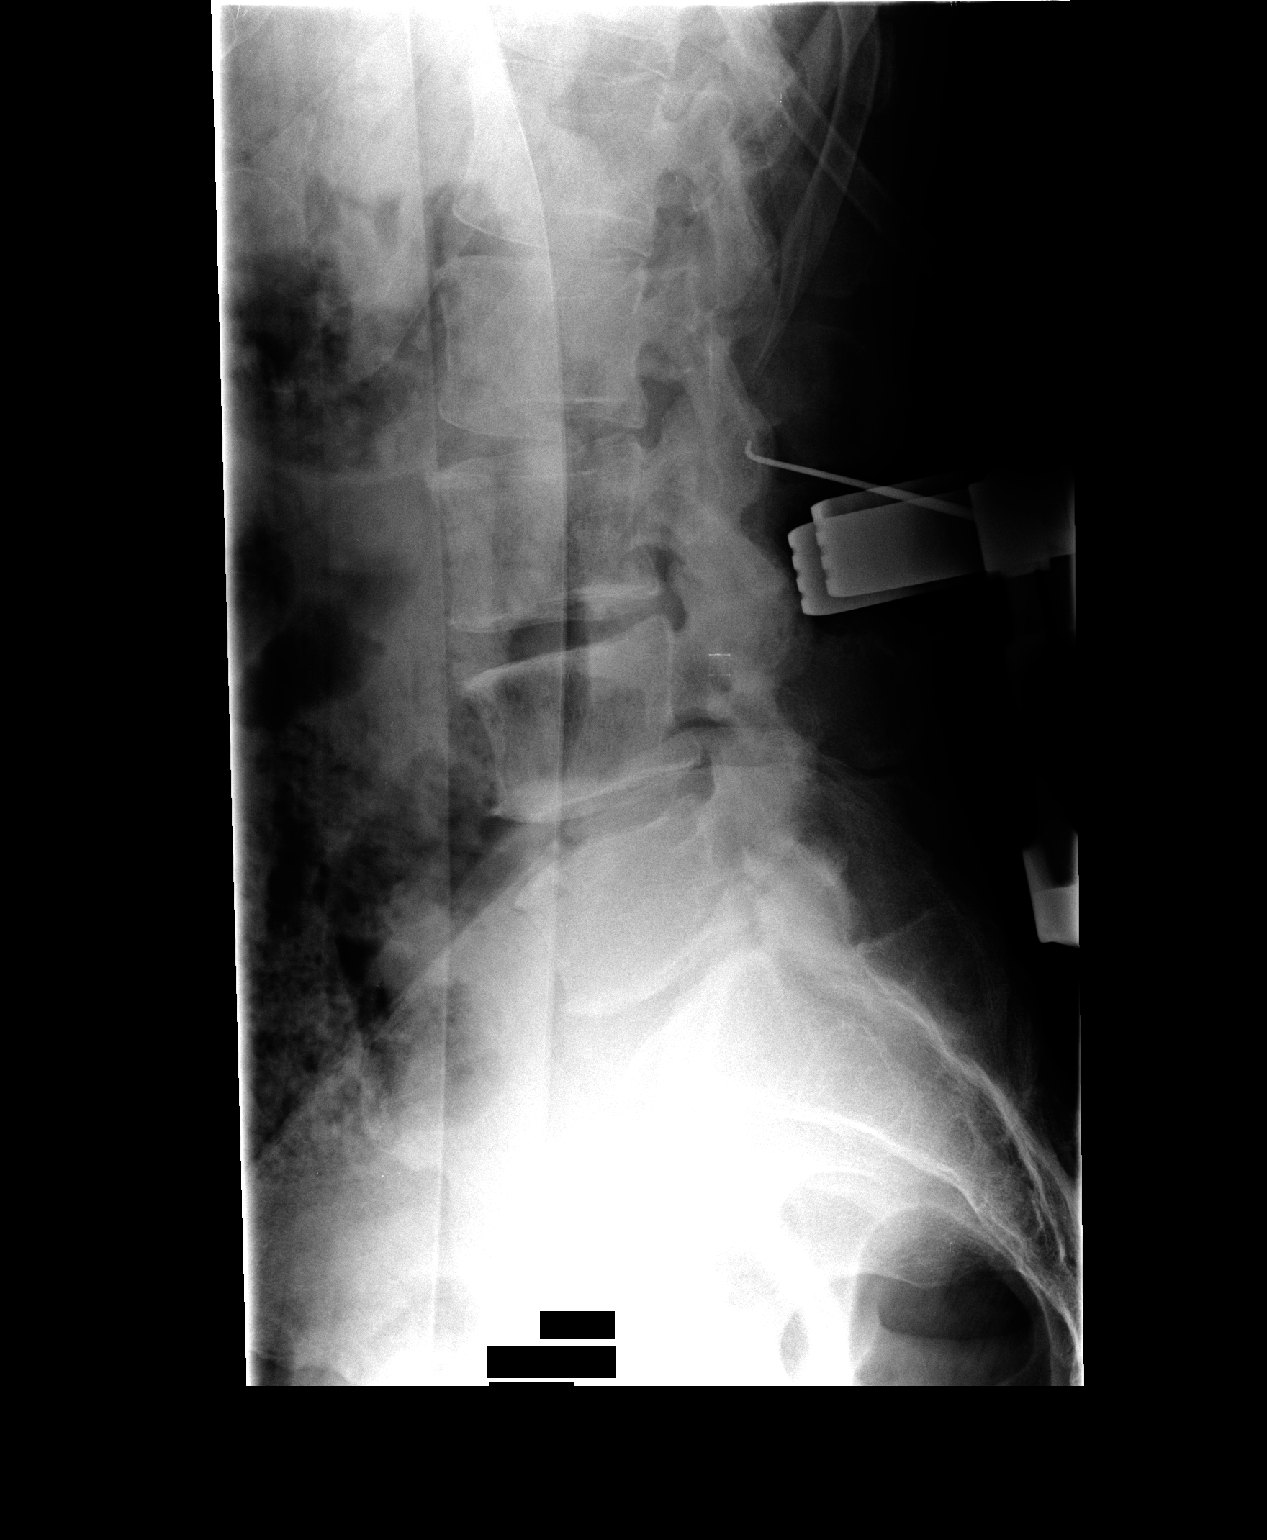

[1 of 1 positions shown; findings below may reference images not displayed]

FINDINGS: The single view shows tissue spreaders posteriorly with a
probe directed at the L2-3 disc level.  Tissue spreaders are
directed at the L3-4 disc level.
IMPRESSION: Instruments posterior to L3, described above.

## 2014-02-24 ENCOUNTER — Ambulatory Visit (INDEPENDENT_AMBULATORY_CARE_PROVIDER_SITE_OTHER): Payer: BLUE CROSS/BLUE SHIELD | Admitting: Internal Medicine

## 2014-02-24 ENCOUNTER — Encounter: Payer: Self-pay | Admitting: Internal Medicine

## 2014-02-24 VITALS — BP 112/66 | HR 55 | Ht 75.0 in | Wt 205.0 lb

## 2014-02-24 DIAGNOSIS — G4733 Obstructive sleep apnea (adult) (pediatric): Secondary | ICD-10-CM

## 2014-02-24 NOTE — Patient Instructions (Signed)
We can continue VPAP 16/ [facility].  You can talk with [facility] about when you could get a new machine  Please call if we can help

## 2014-02-24 NOTE — Assessment & Plan Note (Signed)
He indicates he is quite satisfied with this CPAP machine and settings but he will check with his DME company about replacement as he gets older.

## 2014-02-24 NOTE — Progress Notes (Signed)
02/16/11- 64 yoM former smoker, followed for OSA, complicated by arthritis in spine. LOV-02/16/2010 Continues using VPAP all night, every night. He sets his own pressure and brings his own download report which is filed. Compliance has been quite good. Pressure is around 14 with AHI 6.7 per hour. Had flu vaccine.  02/21/12-64 yoM former smoker, followed for OSA, complicated by arthritis in spine. FOLLOWS FOR: wear VPAP/ ResMed every night for about 8 hours and adjusts pressure as needed. We reviewed his printouts and I explained the terms. He had to leave it off for a while after cervical spine surgery in March of 2013. Since then he has tried increasing pressure from 14.6-15.6. This resulted in a little more leak. He snores through a little at times but says he sleeps fine and feels rested CXR 03/22/11 IMPRESSION:  No acute disease.  Original Report Authenticated By: Arvid Right. D'ALESSIO, M.D.  02/24/13- 64 yoM former smoker, followed for OSA, complicated by arthritis in spine. FOLLOWS FOR: wears VPAP 16 for about 8 hours nightly;DME is [facility]. No new supplies needed at this time. Adjusts own pressure. Has had latest back surgery without complication. He has increased his own VPAP to 16/[facility]. We discussed maintenance and control, and replacement of his machine mucus old.  02/24/14- 64 yoM former smoker, followed for OSA, complicated by arthritis in spine. VPAP 16/ [facility] He is very comfortable with this pressure and expresses no concerns. His machine is about 65 years old and we discussed guidelines for replacement. He will discuss this with his DME company.  ROS-see HPI Constitutional:   No-   weight loss, night sweats, fevers, chills, fatigue, lassitude. HEENT:   No-  headaches, difficulty swallowing, tooth/dental problems, sore throat,       No-  sneezing, itching, ear ache, nasal congestion, post nasal drip,  CV:  No-   chest pain, orthopnea, PND, swelling in lower extremities, anasarca,  dizziness, palpitations Resp: No-   shortness of breath with exertion or at rest.              No-   productive cough,  No non-productive cough,  No- coughing up of blood.              No-   change in color of mucus.  No- wheezing.   Skin: No-   rash or lesions. GI:  No-   heartburn, indigestion, abdominal pain, nausea, vomiting,  GU:. MS:  No-   joint pain or swelling.  + Chronic back pain. Neuro-     nothing unusual Psych:  No- change in mood or affect. No depression or anxiety.  No memory loss.  OBJ General- Alert, Oriented, Affect-appropriate, Distress- none acute, tall, trim, comfortable appearing. Skin- rash-none, lesions- none, excoriation- none Lymphadenopathy- none Head- atraumatic            Eyes- Gross vision intact, PERRLA, conjunctivae clear secretions            Ears- Hearing, canals-normal            Nose- Clear, no-Septal dev, mucus, polyps, erosion, perforation             Throat- Mallampati II-III , mucosa clear , drainage- none, tonsils- atrophic Neck- flexible , trachea midline, no stridor , thyroid nl, carotid no bruit Chest - symmetrical excursion , unlabored           Heart/CV- RRR , no murmur , no gallop  , no rub, nl s1 s2                           -  JVD- none , edema- none, stasis changes- none, varices- none           Lung- clear to P&A, wheeze- none, cough- none , dullness-none, rub- none           Chest wall-  Abd- Br/ Gen/ Rectal- Not done, not indicated Extrem- cyanosis- none, clubbing, none, atrophy- none, strength- nl. Moves stiffly Neuro- grossly intact to observation

## 2015-02-25 ENCOUNTER — Encounter: Payer: Self-pay | Admitting: Internal Medicine

## 2015-02-25 ENCOUNTER — Ambulatory Visit (INDEPENDENT_AMBULATORY_CARE_PROVIDER_SITE_OTHER): Payer: Medicare Other | Admitting: Internal Medicine

## 2015-02-25 VITALS — BP 122/82 | HR 71 | Ht 75.0 in | Wt 204.0 lb

## 2015-02-25 DIAGNOSIS — G4733 Obstructive sleep apnea (adult) (pediatric): Secondary | ICD-10-CM | POA: Diagnosis not present

## 2015-02-25 NOTE — Assessment & Plan Note (Signed)
Compliance and control are excellent. He is very satisfied to continue with his VPAP. We discussed how to go about replacing the machine when time comes. No changes required now.

## 2015-02-25 NOTE — Patient Instructions (Signed)
We can continue VPAP/ [facility]      Please call as needed

## 2015-02-25 NOTE — Progress Notes (Signed)
02/16/11- 60 yoM former smoker, followed for OSA, complicated by arthritis in spine. LOV-02/16/2010 Continues using VPAP all night, every night. He sets his own pressure and brings his own download report which is filed. Compliance has been quite good. Pressure is around 14 with AHI 6.7 per hour. Had flu vaccine.  02/21/12-60 yoM former smoker, followed for OSA, complicated by arthritis in spine. FOLLOWS FOR: wear VPAP/ ResMed every night for about 8 hours and adjusts pressure as needed. We reviewed his printouts and I explained the terms. He had to leave it off for a while after cervical spine surgery in March of 2013. Since then he has tried increasing pressure from 14.6-15.6. This resulted in a little more leak. He snores through a little at times but says he sleeps fine and feels rested CXR 03/22/11 IMPRESSION:  No acute disease.  Original Report Authenticated By: Arvid Right. D'ALESSIO, M.D.  02/24/13- 70 yoM former smoker, followed for OSA, complicated by arthritis in spine. FOLLOWS FOR: wears VPAP 16 for about 8 hours nightly;DME is [facility]. No new supplies needed at this time. Adjusts own pressure. Has had latest back surgery without complication. He has increased his own VPAP to 16/[facility]. We discussed maintenance and control, and replacement of his machine mucus old.  02/24/14- 62 yoM former smoker, followed for OSA, complicated by arthritis in spine. VPAP 16/ [facility] He is very comfortable with this pressure and expresses no concerns. His machine is about 66 years old and we discussed guidelines for replacement. He will discuss this with his DME company.  02/25/2015-65 year old male former smoker followed for OSA, complicated by arthritis in spine VPAP 17 Insp /  [facility]   He sets his own pressure FOLLOWS FOR: Pt did not print out download for todays visit he usually takes care of this himself. Pressure changed to 17 six months ago and leak 0.10. No problem with masks, wearing VPAP every  night.  Rare Ambien if needed for insomnia  ROS-see HPI Constitutional:   No-   weight loss, night sweats, fevers, chills, fatigue, lassitude. HEENT:   No-  headaches, difficulty swallowing, tooth/dental problems, sore throat,       No-  sneezing, itching, ear ache, nasal congestion, post nasal drip,  CV:  No-   chest pain, orthopnea, PND, swelling in lower extremities, anasarca, dizziness, palpitations Resp: No-   shortness of breath with exertion or at rest.              No-   productive cough,  No non-productive cough,  No- coughing up of blood.              No-   change in color of mucus.  No- wheezing.   Skin: No-   rash or lesions. GI:  No-   heartburn, indigestion, abdominal pain, nausea, vomiting,  GU:. MS:  No-   joint pain or swelling.  + Chronic back pain. Neuro-     nothing unusual Psych:  No- change in mood or affect. No depression or anxiety.  No memory loss.  OBJ General- Alert, Oriented, Affect-appropriate, Distress- none acute, tall, trim, comfortable appearing. Skin- rash-none, lesions- none, excoriation- none Lymphadenopathy- none Head- atraumatic            Eyes- Gross vision intact, PERRLA, conjunctivae clear secretions            Ears- Hearing, canals-normal            Nose- Clear, no-Septal dev, mucus, polyps, erosion, perforation  Throat- Mallampati II-III , mucosa clear , drainage- none, tonsils- atrophic Neck- flexible , trachea midline, no stridor , thyroid nl, carotid no bruit Chest - symmetrical excursion , unlabored           Heart/CV- RRR , no murmur , no gallop  , no rub, nl s1 s2                           - JVD- none , edema- none, stasis changes- none, varices- none           Lung- clear to P&A, wheeze- none, cough- none , dullness-none, rub- none           Chest wall-  Abd- Br/ Gen/ Rectal- Not done, not indicated Extrem- cyanosis- none, clubbing, none, atrophy- none, strength- nl. Moves stiffly Neuro- grossly intact to  observation

## 2015-11-24 ENCOUNTER — Other Ambulatory Visit: Payer: Self-pay | Admitting: Neurological Surgery

## 2015-11-24 DIAGNOSIS — M5124 Other intervertebral disc displacement, thoracic region: Secondary | ICD-10-CM

## 2015-11-28 ENCOUNTER — Ambulatory Visit
Admission: RE | Admit: 2015-11-28 | Discharge: 2015-11-28 | Disposition: A | Discharge status: Home / Self Care | Payer: Medicare Other | Source: Ambulatory Visit | Attending: Neurological Surgery | Admitting: Neurological Surgery

## 2015-11-28 DIAGNOSIS — M5124 Other intervertebral disc displacement, thoracic region: Secondary | ICD-10-CM

## 2015-11-28 MED ORDER — IOPAMIDOL (ISOVUE-M 300) INJECTION 61%
10.0000 mL | Freq: Once | INTRAMUSCULAR | Status: AC | PRN
  Administered 2015-11-28: 10 mL via INTRATHECAL

## 2015-11-28 MED ORDER — DIAZEPAM 5 MG PO TABS
5.0000 mg | ORAL_TABLET | Freq: Once | ORAL | Status: AC
Start: 2015-11-28 — End: 2015-11-28
  Administered 2015-11-28: 5 mg via ORAL

## 2015-11-28 MED ORDER — DIAZEPAM 5 MG PO TABS
5.0000 mg | ORAL_TABLET | Freq: Once | ORAL | Status: AC
  Administered 2015-11-28: 5 mg via ORAL

## 2015-11-28 MED ORDER — ONDANSETRON HCL 4 MG/2ML IJ SOLN: 4.0000 mg | Freq: Four times a day (QID) | INTRAMUSCULAR | Status: DC | PRN

## 2015-11-28 NOTE — Discharge Instructions (Signed)
Myelogram Discharge Instructions  1. Go home and rest quietly for the next 24 hours.  It is important to lie flat for the next 24 hours.  Get up only to go to the restroom.  You may lie in the bed or on a couch on your back, your stomach, your left side or your right side.  You may have one pillow under your head.  You may have pillows between your knees while you are on your side or under your knees while you are on your back.  2. DO NOT drive today.  Recline the seat as far back as it will go, while still wearing your seat belt, on the way home.  3. You may get up to go to the bathroom as needed.  You may sit up for 10 minutes to eat.  You may resume your normal diet and medications unless otherwise indicated.  Drink plenty of extra fluids today and tomorrow.  4. The incidence of a spinal headache with nausea and/or vomiting is about 5% (one in 20 patients).  If you develop a headache, lie flat and drink plenty of fluids until the headache goes away.  Caffeinated beverages may be helpful.  If you develop severe nausea and vomiting or a headache that does not go away with flat bed rest, call [phone].  5. You may resume normal activities after your 24 hours of bed rest is over; however, do not exert yourself strongly or do any heavy lifting tomorrow.  6. Call your physician for a follow-up appointment.   You may resume Cymbalta on Tuesday, November 29, 2015 after 8:30a.m.

## 2015-11-28 NOTE — Progress Notes (Signed)
Patient states his last dose of Cymbalta was at least two days ago.  jkl

## 2015-12-01 ENCOUNTER — Telehealth: Payer: Self-pay

## 2015-12-01 NOTE — Telephone Encounter (Signed)
Spoke with patient after he had a myelogram here 11/28/15.  He states he's been out playing golf, doing well and has no headache.  jkl

## 2015-12-13 ENCOUNTER — Ambulatory Visit (INDEPENDENT_AMBULATORY_CARE_PROVIDER_SITE_OTHER): Payer: Medicare Other | Admitting: Neurology

## 2015-12-13 ENCOUNTER — Encounter: Payer: Self-pay | Admitting: Neurology

## 2015-12-13 VITALS — BP 138/81 | HR 59 | Ht 75.0 in | Wt 204.5 lb

## 2015-12-13 DIAGNOSIS — R269 Unspecified abnormalities of gait and mobility: Secondary | ICD-10-CM

## 2015-12-13 DIAGNOSIS — R202 Paresthesia of skin: Secondary | ICD-10-CM | POA: Diagnosis not present

## 2015-12-13 DIAGNOSIS — G4733 Obstructive sleep apnea (adult) (pediatric): Secondary | ICD-10-CM | POA: Diagnosis not present

## 2015-12-13 DIAGNOSIS — Z981 Arthrodesis status: Secondary | ICD-10-CM | POA: Diagnosis not present

## 2015-12-13 MED ORDER — BACLOFEN 10 MG PO TABS: 10.0000 mg | ORAL_TABLET | Freq: Three times a day (TID) | ORAL | 6 refills | Status: DC

## 2015-12-13 NOTE — Progress Notes (Signed)
PATIENT: [name] DOB: 04/01/1950  Chief Complaint  Patient presents with  . Numbness    Reports numbness/tingling in his bilateral legs since 2008.  He also has tingling in his face and arms.  He has history of five back surgeries.  Marland Kitchen PCP    Prince Solian, MD     [place] is a 65 year old right-handed male, seen in refer by his neurosurgeon Dr. Eustace Moore, MD, for evaluation of worsening bilateral lower extremity paresthesia, stiffness, new onset bilateral arm and facial intermittent paresthesia, his primary care physician is Dr.Ravisankar Avva, initial evaluation was December 13 2015.  He had multiple spine decompression surgery over the past 10 years, he presented with low back pain in 2008, had his first lumbar decompression surgery, which fail to help his low back pain, he also developed numbness from bilateral mid thigh down, stiffness, mild unsteady gait, evaluation demonstrated thoracic spinal stenosis, he had T 9, 10, 11 decompression and fusion surgery in December 2008, but he had a persistent numbness from mid thigh down, unsteady stiff gait since then.  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, remaining active, plays golfs regularly without much difficulty, since 2016, he noticed worsening bilateral lower extremity paresthesia stiffness, and new onset intermittent around mouth paresthesia, also new onset bilateral upper extremity paresthesia, he denies bilateral hands numbness or weakness, still able to work on his [employer],  He denies bowel and bladder incontinence,  We have personally reviewed CT myelogram of thoracic spine in October 2017, evidence of solid fusion T9-11, there was moderate spinal stenosis at T7-8, with right paracentral disc protrusion  touching the thecal sac, but there was no evidence of spinal cord compression.  We also personally reviewed MRI of lumbar in January 2013, severe spinal stenosis at L3-4, multilevel degenerative changes, he later had lumbar decompression surgery.  CT head without contrast in 2012 showed no significant abnormality. MRI cervical spine in 2017, evidence of C3-7 cervical fusion, congenital mild narrowing of the canal, no significant stenosis  Patient reported normal EMG nerve conduction study of bilateral lower extremity in October 2017  REVIEW OF SYSTEMS: Full 14 system review of systems performed and notable only for numbness, weakness, snoring, joint pain  ALLERGIES: Allergies  Allergen Reactions  . Betadine [Povidone Iodine] Rash    HOME MEDICATIONS: Current Outpatient Prescriptions  Medication Sig Dispense Refill  . aspirin 81 MG tablet Take 81 mg by mouth daily.    Marland Kitchen atorvastatin (LIPITOR) 40 MG tablet Take 40 mg by mouth daily.     . DULoxetine (CYMBALTA) 60 MG capsule Take 60 mg by mouth daily.    . naproxen sodium (ANAPROX) 220 MG tablet Take 440 mg by mouth 2 (two) times daily with a meal.    . Testosterone 40.5 MG/2.5GM (1.62%) GEL Applies 3 pumps on skin once a day    . vitamin B-12 (CYANOCOBALAMIN) 1000 MCG tablet Take 2,500 mcg by mouth daily.     Marland Kitchen zolpidem (AMBIEN CR) 12.5 MG CR tablet Take 12.5 mg by mouth at bedtime as needed for sleep.     No current facility-administered medications for this visit.     PAST MEDICAL HISTORY: Past Medical History:  Diagnosis Date  . Arthritis   . Back pain   . Balance  disorder    post lumbar surgery.  . Cancer ([place])    basal cell CA- removed from back  . Cataract   . Complication of anesthesia    difficullty voiding  . Degenerative disk disease   . Depression   . Headache(784.0)    no longer since cerviacl disc surgery  . Hemorrhoids   . Hyperlipidemia   . Numbness and tingling   . Sleep apnea    uses  C pap   .  Spinal stenosis of lumbar region     PAST SURGICAL HISTORY: Past Surgical History:  Procedure Laterality Date  . APPENDECTOMY    . CATARACT EXTRACTION     Left  . cervical disectomy  05/09/2011  . FOOT FUSION     Right great toe  . KNEE ARTHROSCOPY     x 2, bilateral  . LUMBAR DISC SURGERY    . LUMBAR LAMINECTOMY/DECOMPRESSION MICRODISCECTOMY  04/04/2011   Procedure: LUMBAR LAMINECTOMY/DECOMPRESSION MICRODISCECTOMY 2 LEVELS;  Surgeon: Eustace Moore, MD;  Location: [facility];  Service: Neurosurgery;  Laterality: N/A;  lumbar laminectomy lumbar three-four, lumbar four-five  . NASAL SEPTUM SURGERY    . POSTERIOR CERVICAL FUSION/FORAMINOTOMY N/A 01/28/2013   Procedure: POSTERIOR CERVICAL FUSION/FORAMINOTOMY CERVICAL SIX-SEVEN;  Surgeon: Eustace Moore, MD;  Location: [facility];  Service: Neurosurgery;  Laterality: N/A;  . SKIN CANCER EXCISION     2007  . thoracic vertebral decompression      FAMILY HISTORY: Family History  Problem Relation Age of Onset  . Heart attack Father   . Diabetes Father   . Heart disease    . Cancer Mother     Marena Chancy of type  . Sleep apnea Brother   . Heart attack Brother   . Alcohol abuse Brother   . Anesthesia problems Neg Hx   . Colon cancer Neg Hx   . Esophageal cancer Neg Hx   . Rectal cancer Neg Hx   . Stomach cancer Neg Hx     SOCIAL HISTORY:  Social History   Social History  . Marital status: Married    Spouse name: N/A  . Number of children: 1  . Years of education: Bachelors   Occupational History  . Retired-due to back pain [employer]   Social History Main Topics  . Smoking status: Former Smoker    Years: 8.00    Types: Cigarettes    Quit date: 03/21/1981  . Smokeless tobacco: Never Used  . Alcohol use Yes     Comment: daily glass of wine  . Drug use: No  . Sexual activity: Yes   Other Topics Concern  . Not on file   Social History Narrative   Lives at home with wife.    Right-handed.   30oz caffeine per day.     PHYSICAL EXAM   Vitals:   12/13/15 0804  BP: 138/81  Pulse: (!) 59  Weight: 204 lb 8 oz (92.8 kg)  Height: 6\' 3"  (1.905 m)    Not recorded      Body mass index is 25.56 kg/m.  PHYSICAL EXAMNIATION:  Gen: NAD, conversant, well nourised, obese, well groomed                     Cardiovascular: Regular rate rhythm, no peripheral edema, warm, nontender. Eyes: Conjunctivae clear without exudates or hemorrhage Neck: Supple, no carotid bruits.Limited range of motion Pulmonary: Clear to auscultation bilaterally   NEUROLOGICAL EXAM:  MENTAL STATUS: Speech:    Speech is normal; fluent and spontaneous with normal comprehension.  Cognition:     Orientation to time, place and person     Normal recent and remote memory     Normal Attention span and concentration     Normal Language, naming, repeating,spontaneous speech     Fund of knowledge   CRANIAL NERVES: CN II: Visual fields are full to confrontation. Fundoscopic exam is normal with sharp discs and no vascular changes. Pupils are round equal and briskly reactive to light. CN III, IV, VI: extraocular movement are normal. No ptosis. CN V: Facial sensation is intact to pinprick in all 3 divisions bilaterally. Corneal responses are intact.  CN VII: Face is symmetric with normal eye closure and smile. CN VIII: Hearing is normal to rubbing fingers CN IX, X: Palate elevates symmetrically. Phonation is normal. CN XI: Head turning and shoulder shrug are intact CN XII: Tongue is midline with normal movements and no atrophy.  MOTOR: He has moderate spasticity of bilateral lower extremity, no significant upper or lower extremity weakness  REFLEXES: Reflexes are 3 and symmetric at the biceps, triceps, knees, and ankles. Nonsustained ankle clonus, Plantar responses are extensor bilaterally  SENSORY: Intact to light touch, pinprick, positional sensation and vibratory sensation are intact in  fingers and toes.  COORDINATION: Rapid alternating movements and fine finger movements are intact. There is no dysmetria on finger-to-nose and heel-knee-shin.    GAIT/STANCE: He needs push up to get up from seated position, stiff, mildly unsteady gait, able to stand up on tiptoe, heel, could not perform tandem walking   DIAGNOSTIC DATA (LABS, IMAGING, TESTING) - I reviewed patient records, labs, notes, testing and imaging myself where available.   ASSESSMENT AND PLAN  [name] is a 65 y.o. male   History of thoracic spondylitic myelopathy, status post decompression and fusion of T9-11 in 2008, With residual bilateral lower extremity spasticity, unsteady gait, motor neuron signs on examinations Add on baclofen 10 mg 3 times a day  New onset intermittent facial and bilateral upper extremity paresthesia Potential localization to cervical spine versus lower brainstem Proceed with MRI of the brain,    Marcial Pacas, M.D. Ph.D.  [facility] [address] [place], [place] [address] Ph: [phone] Fax: [phone]  CC: Eustace Moore, MD, primary care physician Dr.Ravisankar Avva

## 2015-12-21 ENCOUNTER — Ambulatory Visit
Admission: RE | Admit: 2015-12-21 | Discharge: 2015-12-21 | Disposition: A | Discharge status: Home / Self Care | Payer: Medicare Other | Source: Ambulatory Visit | Attending: Neurology | Admitting: Neurology

## 2015-12-21 DIAGNOSIS — G4733 Obstructive sleep apnea (adult) (pediatric): Secondary | ICD-10-CM

## 2015-12-21 DIAGNOSIS — R269 Unspecified abnormalities of gait and mobility: Secondary | ICD-10-CM

## 2015-12-21 DIAGNOSIS — R202 Paresthesia of skin: Secondary | ICD-10-CM

## 2015-12-21 DIAGNOSIS — Z981 Arthrodesis status: Secondary | ICD-10-CM

## 2016-01-16 ENCOUNTER — Encounter: Payer: Self-pay | Admitting: Neurology

## 2016-01-16 ENCOUNTER — Ambulatory Visit (INDEPENDENT_AMBULATORY_CARE_PROVIDER_SITE_OTHER): Payer: Medicare Other | Admitting: Neurology

## 2016-01-16 VITALS — BP 127/71 | HR 62 | Ht 75.0 in | Wt 206.0 lb

## 2016-01-16 DIAGNOSIS — Z981 Arthrodesis status: Secondary | ICD-10-CM

## 2016-01-16 DIAGNOSIS — R202 Paresthesia of skin: Secondary | ICD-10-CM

## 2016-01-16 DIAGNOSIS — R269 Unspecified abnormalities of gait and mobility: Secondary | ICD-10-CM

## 2016-01-16 NOTE — Progress Notes (Signed)
PATIENT: [name] DOB: 12-14-1950  Chief Complaint  Patient presents with  . Numbness    He would like to review the results of brain MRI.       HISTORICAL  [name] is a 65 year old right-handed male, seen in refer by his neurosurgeon Dr. Eustace Moore, MD, for evaluation of worsening bilateral lower extremity paresthesia, stiffness, new onset bilateral arm and facial intermittent paresthesia, his primary care physician is Dr.Ravisankar Avva, initial evaluation was December 13 2015.  He had multiple spine decompression surgery over the past 10 years, he presented with low back pain in 2008, had his first lumbar decompression surgery, which fail to help his low back pain, he also developed numbness from bilateral mid thigh down, stiffness, mild unsteady gait, evaluation demonstrated thoracic spinal stenosis, he had T 9, 10, 11 decompression and fusion surgery in December 2008, but he had a persistent numbness from mid thigh down, unsteady stiff gait since then.  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, remaining active, plays golfs regularly without much difficulty, since 2016, he noticed worsening bilateral lower extremity paresthesia stiffness, and new onset intermittent around mouth paresthesia, also new onset bilateral upper extremity paresthesia, he denies bilateral hands numbness or weakness, still able to work on his [employer],  He denies bowel and bladder incontinence,  We have personally reviewed CT myelogram of thoracic spine in October 2017, evidence of solid fusion T9-11, there was moderate spinal stenosis at T7-8, with right paracentral disc protrusion touching the thecal sac, but there was no evidence of spinal cord compression.  We also personally reviewed MRI of lumbar in  January 2013, severe spinal stenosis at L3-4, multilevel degenerative changes, he later had lumbar decompression surgery.  CT head without contrast in 2012 showed no significant abnormality. MRI cervical spine in 2017, evidence of C3-7 cervical fusion, congenital mild narrowing of the canal, no significant stenosis  Patient reported normal EMG nerve conduction study of bilateral lower extremity in October 2017  UPDATE Dec 4th 2017: He has tried baclofen 10mg  tid, did not help him.  We have personally reviewed MRI brain, showed mild gliosis no acute lesions.  He wear CPAP every night,  If he put on the facial mask, the pressure at the occipital region will send paresthesia around his mouth and spine.  He also noticed increased bilateral lower extremity spasticity  REVIEW OF SYSTEMS: Full 14 system review of systems performed and notable only for as above. ALLERGIES: Allergies  Allergen Reactions  . Betadine [Povidone Iodine] Rash    HOME MEDICATIONS: Current Outpatient Prescriptions  Medication Sig Dispense Refill  . aspirin 81 MG tablet Take 81 mg by mouth daily.    Marland Kitchen atorvastatin (LIPITOR) 40 MG tablet Take 40 mg by mouth daily.     . baclofen (LIORESAL) 10 MG tablet Take 1 tablet (10 mg total) by mouth 3 (three) times daily. 90 each 6  . DULoxetine (CYMBALTA) 60 MG capsule Take 60 mg by mouth daily.    . naproxen sodium (ANAPROX) 220 MG tablet Take 440 mg by mouth 2 (two) times daily with a meal.    . Testosterone 40.5 MG/2.5GM (1.62%) GEL Applies 3 pumps on skin once a day    . vitamin B-12 (CYANOCOBALAMIN) 1000 MCG tablet Take 2,500 mcg by mouth  daily.     . zolpidem (AMBIEN CR) 12.5 MG CR tablet Take 12.5 mg by mouth at bedtime as needed for sleep.     No current facility-administered medications for this visit.     PAST MEDICAL HISTORY: Past Medical History:  Diagnosis Date  . Arthritis   . Back pain   . Balance disorder    post lumbar surgery.  . Cancer ([place])    basal  cell CA- removed from back  . Cataract   . Complication of anesthesia    difficullty voiding  . Degenerative disk disease   . Depression   . Headache(784.0)    no longer since cerviacl disc surgery  . Hemorrhoids   . Hyperlipidemia   . Numbness and tingling   . Sleep apnea    uses  C pap   . Spinal stenosis of lumbar region     PAST SURGICAL HISTORY: Past Surgical History:  Procedure Laterality Date  . APPENDECTOMY    . CATARACT EXTRACTION     Left  . cervical disectomy  05/09/2011  . FOOT FUSION     Right great toe  . KNEE ARTHROSCOPY     x 2, bilateral  . LUMBAR DISC SURGERY    . LUMBAR LAMINECTOMY/DECOMPRESSION MICRODISCECTOMY  04/04/2011   Procedure: LUMBAR LAMINECTOMY/DECOMPRESSION MICRODISCECTOMY 2 LEVELS;  Surgeon: Eustace Moore, MD;  Location: [facility];  Service: Neurosurgery;  Laterality: N/A;  lumbar laminectomy lumbar three-four, lumbar four-five  . NASAL SEPTUM SURGERY    . POSTERIOR CERVICAL FUSION/FORAMINOTOMY N/A 01/28/2013   Procedure: POSTERIOR CERVICAL FUSION/FORAMINOTOMY CERVICAL SIX-SEVEN;  Surgeon: Eustace Moore, MD;  Location: [facility];  Service: Neurosurgery;  Laterality: N/A;  . SKIN CANCER EXCISION     2007  . thoracic vertebral decompression      FAMILY HISTORY: Family History  Problem Relation Age of Onset  . Heart attack Father   . Diabetes Father   . Heart disease    . Cancer Mother     Marena Chancy of type  . Sleep apnea Brother   . Heart attack Brother   . Alcohol abuse Brother   . Anesthesia problems Neg Hx   . Colon cancer Neg Hx   . Esophageal cancer Neg Hx   . Rectal cancer Neg Hx   . Stomach cancer Neg Hx     SOCIAL HISTORY:  Social History   Social History  . Marital status: Married    Spouse name: N/A  . Number of children: 1  . Years of education: Bachelors   Occupational History  . Retired-due to back pain [place]   Social History Main Topics  . Smoking status: Former Smoker     Years: 8.00    Types: Cigarettes    Quit date: 03/21/1981  . Smokeless tobacco: Never Used  . Alcohol use Yes     Comment: daily glass of wine  . Drug use: No  . Sexual activity: Yes   Other Topics Concern  . Not on file   Social History Narrative   Lives at home with wife.   Right-handed.   30oz caffeine per day.     PHYSICAL EXAM   Vitals:   01/16/16 0950  BP: 127/71  Pulse: 62  Weight: 206 lb (93.4 kg)  Height: 6\' 3"  (1.905 m)    Not recorded      Body mass index is 25.75 kg/m.  PHYSICAL EXAMNIATION:  Gen: NAD, conversant, well  nourised, obese, well groomed                     Cardiovascular: Regular rate rhythm, no peripheral edema, warm, nontender. Eyes: Conjunctivae clear without exudates or hemorrhage Neck: Supple, no carotid bruits.Limited range of motion Pulmonary: Clear to auscultation bilaterally   NEUROLOGICAL EXAM:  MENTAL STATUS: Speech:    Speech is normal; fluent and spontaneous with normal comprehension.  Cognition:     Orientation to time, place and person     Normal recent and remote memory     Normal Attention span and concentration     Normal Language, naming, repeating,spontaneous speech     Fund of knowledge   CRANIAL NERVES: CN II: Visual fields are full to confrontation. Fundoscopic exam is normal with sharp discs and no vascular changes. Pupils are round equal and briskly reactive to light. CN III, IV, VI: extraocular movement are normal. No ptosis. CN V: Facial sensation is intact to pinprick in all 3 divisions bilaterally. Corneal responses are intact.  CN VII: Face is symmetric with normal eye closure and smile. CN VIII: Hearing is normal to rubbing fingers CN IX, X: Palate elevates symmetrically. Phonation is normal. CN XI: Head turning and shoulder shrug are intact CN XII: Tongue is midline with normal movements and no atrophy.  MOTOR: He has moderate spasticity of bilateral lower extremity, no significant upper or  lower extremity weakness  REFLEXES: Reflexes are 3 and symmetric at the biceps, triceps, knees, and ankles. Nonsustained ankle clonus, Plantar responses are extensor bilaterally  SENSORY: Intact to light touch, pinprick, positional sensation and vibratory sensation are intact in fingers and toes.  COORDINATION: Rapid alternating movements and fine finger movements are intact. There is no dysmetria on finger-to-nose and heel-knee-shin.    GAIT/STANCE: He needs push up to get up from seated position, stiff, mildly unsteady gait, able to stand up on tiptoe, heel, could not perform tandem walking   DIAGNOSTIC DATA (LABS, IMAGING, TESTING) - I reviewed patient records, labs, notes, testing and imaging myself where available.   ASSESSMENT AND PLAN  [name] is a 65 y.o. male   History of thoracic spondylitic myelopathy, status post decompression and fusion of T9-11 in 2008, With residual bilateral lower extremity spasticity, unsteady gait, upper motor neuron signs on examinations    New onset intermittent facial and bilateral upper extremity paresthesia Potential localization to cervical spine versus lower brainstem Keep cymbalta 60mg  daily  Marcial Pacas, M.D. Ph.D.  [facility] [address] [place], [place] [address] Ph: [phone] Fax: [phone]  CC: Eustace Moore, MD, primary care physician Dr.Ravisankar Avva

## 2016-01-23 ENCOUNTER — Telehealth: Payer: Self-pay | Admitting: *Deleted

## 2016-01-23 NOTE — Telephone Encounter (Signed)
Labs received from [facility] (collected on 01/17/16):  Vitamin B12: 1654 (H)  CMP: ALT: 47 (H)            All other values WNL  TSH: 1.23

## 2016-02-28 ENCOUNTER — Ambulatory Visit: Payer: Medicare Other | Admitting: Internal Medicine

## 2016-02-29 ENCOUNTER — Ambulatory Visit: Payer: Medicare Other | Admitting: Internal Medicine

## 2016-04-11 ENCOUNTER — Encounter: Payer: Self-pay | Admitting: Internal Medicine

## 2016-04-12 ENCOUNTER — Ambulatory Visit (INDEPENDENT_AMBULATORY_CARE_PROVIDER_SITE_OTHER): Payer: Medicare Other | Admitting: Internal Medicine

## 2016-04-12 ENCOUNTER — Encounter: Payer: Self-pay | Admitting: Internal Medicine

## 2016-04-12 VITALS — BP 114/70 | HR 60 | Ht 75.0 in | Wt 207.6 lb

## 2016-04-12 DIAGNOSIS — R202 Paresthesia of skin: Secondary | ICD-10-CM

## 2016-04-12 DIAGNOSIS — G4733 Obstructive sleep apnea (adult) (pediatric): Secondary | ICD-10-CM

## 2016-04-12 NOTE — Patient Instructions (Signed)
Order- DME Advanced- please evaluate eligibility for replacement of old VPAP Auto 25 machine 17 ins, 12 exp, mask of choice, humidifier, supplies, AirView   Dx OSA  Please call as needed

## 2016-04-12 NOTE — Progress Notes (Signed)
HPI male former smoker followed for OSA, complicated by arthritis in spine complicated by arthritis, degenerative disc disease NPSG 04/04/05 AHI 13/ hr, desaturation to 88%  ----------------------------------------------------------------------------------------------  02/25/2015-66 year old male former smoker followed for OSA, complicated by arthritis in spine VPAP 17 Insp /  Advanced   He sets his own pressure FOLLOWS FOR: Pt did not print out download for todays visit he usually takes care of this himself. Pressure changed to 17 six months ago and leak 0.10. No problem with masks, wearing VPAP every night.  Rare Ambien if needed for insomnia  3/48//5918- 66 year old male former smoker followed for OSA, complicated by arthritis in spine VPAP 17 Insp 12 Exp /  Advanced   He sets his own pressure FOLLOWS FOR: DME: [facility]. Pt VPAP every night and DL attached. No issues with pressure; pt does not need supplies at this time. His machine is at least 67 years old and we discussed how to check with his DME company to see if he is eligible for replacement. Download confirms 90% 4 hour compliance, AHI 5.2/hour. He feels well rested with reports of snoring.  ROS-see HPI Constitutional:   No-   weight loss, night sweats, fevers, chills, fatigue, lassitude. HEENT:   No-  headaches, difficulty swallowing, tooth/dental problems, sore throat,       No-  sneezing, itching, ear ache, nasal congestion, post nasal drip,  CV:  No-   chest pain, orthopnea, PND, swelling in lower extremities, anasarca, dizziness, palpitations Resp: No-   shortness of breath with exertion or at rest.              No-   productive cough,  No non-productive cough,  No- coughing up of blood.              No-   change in color of mucus.  No- wheezing.   Skin: No-   rash or lesions. GI:  No-   heartburn, indigestion, abdominal pain, nausea, vomiting,  GU:. MS:  No-   joint pain or swelling.  + Chronic back pain. Neuro-     nothing  unusual Psych:  No- change in mood or affect. No depression or anxiety.  No memory loss.  OBJ General- Alert, Oriented, Affect-appropriate, Distress- none acute, tall, trim, comfortable appearing. Skin- rash-none, lesions- none, excoriation- none Lymphadenopathy- none Head- atraumatic            Eyes- Gross vision intact, PERRLA, conjunctivae clear secretions            Ears- Hearing, canals-normal            Nose- Clear, no-Septal dev, mucus, polyps, erosion, perforation             Throat- Mallampati II-III , mucosa clear , drainage- none, tonsils- atrophic Neck- flexible , trachea midline, no stridor , thyroid nl, carotid no bruit Chest - symmetrical excursion , unlabored           Heart/CV- RRR , no murmur , no gallop  , no rub, nl s1 s2                           - JVD- none , edema- none, stasis changes- none, varices- none           Lung- clear to P&A, wheeze- none, cough- none , dullness-none, rub- none           Chest wall-  Abd- Br/ Gen/ Rectal- Not done, not indicated  Extrem- cyanosis- none, clubbing, none, atrophy- none, strength- nl. Moves stiffly Neuro- grossly intact to observation

## 2016-04-13 NOTE — Assessment & Plan Note (Signed)
Control and compliance is been very good. He has been successful with a VPAP machine. Plan-evaluate for replacement of old machine as appropriate

## 2016-04-13 NOTE — Assessment & Plan Note (Signed)
Discomforts associated with his chronic degenerative vertebral disease are pertinent to loss to the extent that they bother his sleep and currently he seems to be managing pretty well.

## 2016-05-01 ENCOUNTER — Telehealth: Payer: Self-pay | Admitting: Internal Medicine

## 2016-05-01 DIAGNOSIS — G4733 Obstructive sleep apnea (adult) (pediatric): Secondary | ICD-10-CM

## 2016-05-01 NOTE — Telephone Encounter (Signed)
States mask he needs is Airtouch S20. States deals with [facility] on [address]. also.

## 2016-05-01 NOTE — Telephone Encounter (Signed)
Pt went to [facility] to get a new cpap mask, was told we needed to place an order for the particular mask- pt requesting airtouch F20.   Order has been placed.  Nothing further needed.

## 2016-06-12 ENCOUNTER — Emergency Department (HOSPITAL_COMMUNITY)
Admission: EM | Admit: 2016-06-12 | Discharge: 2016-06-12 | Disposition: A | Discharge status: Home / Self Care | Payer: Medicare Other | Attending: Emergency Medicine | Admitting: Emergency Medicine

## 2016-06-12 ENCOUNTER — Encounter (HOSPITAL_COMMUNITY): Payer: Self-pay | Admitting: Emergency Medicine

## 2016-06-12 ENCOUNTER — Emergency Department (HOSPITAL_COMMUNITY): Payer: Medicare Other

## 2016-06-12 DIAGNOSIS — Z7982 Long term (current) use of aspirin: Secondary | ICD-10-CM | POA: Insufficient documentation

## 2016-06-12 DIAGNOSIS — Z87891 Personal history of nicotine dependence: Secondary | ICD-10-CM | POA: Diagnosis not present

## 2016-06-12 DIAGNOSIS — R072 Precordial pain: Secondary | ICD-10-CM | POA: Diagnosis not present

## 2016-06-12 DIAGNOSIS — Z79899 Other long term (current) drug therapy: Secondary | ICD-10-CM | POA: Diagnosis not present

## 2016-06-12 DIAGNOSIS — Z85828 Personal history of other malignant neoplasm of skin: Secondary | ICD-10-CM | POA: Diagnosis not present

## 2016-06-12 DIAGNOSIS — R079 Chest pain, unspecified: Secondary | ICD-10-CM | POA: Diagnosis present

## 2016-06-12 LAB — CBC
HCT: 43.8 % (ref 39.0–52.0)
Hemoglobin: 15.1 g/dL (ref 13.0–17.0)
MCH: 32.2 pg (ref 26.0–34.0)
MCHC: 34.5 g/dL (ref 30.0–36.0)
MCV: 93.4 fL (ref 78.0–100.0)
PLATELETS: 234 10*3/uL (ref 150–400)
RBC: 4.69 MIL/uL (ref 4.22–5.81)
RDW: 12.4 % (ref 11.5–15.5)
WBC: 4.7 10*3/uL (ref 4.0–10.5)

## 2016-06-12 LAB — BASIC METABOLIC PANEL
Anion gap: 6 (ref 5–15)
BUN: 18 mg/dL (ref 6–20)
CALCIUM: 9.7 mg/dL (ref 8.9–10.3)
CO2: 27 mmol/L (ref 22–32)
CREATININE: 0.96 mg/dL (ref 0.61–1.24)
Chloride: 104 mmol/L (ref 101–111)
GFR calc non Af Amer: >60 mL/min (ref >60)
Glucose, Bld: 104 mg/dL — ABNORMAL HIGH (ref 65–99)
Potassium: 4.2 mmol/L (ref 3.5–5.1)
SODIUM: 137 mmol/L (ref 135–145)

## 2016-06-12 LAB — I-STAT TROPONIN, ED
TROPONIN I, POC: 0 ng/mL (ref 0.00–0.08)
Troponin i, poc: 0 ng/mL (ref 0.00–0.08)

## 2016-06-12 LAB — D-DIMER, QUANTITATIVE: D-Dimer, Quant: 0.31 ug/mL-FEU (ref 0.00–0.50)

## 2016-06-12 NOTE — Discharge Instructions (Signed)
Please schedule a follow-up appointment with your primary care physician for further workup and management of your symptoms. If any symptoms change or worsen, please return to the nearest emergency department.

## 2016-06-12 NOTE — ED Notes (Signed)
Pt ambulatory at DC, NAD, VSS. Pt verbalizes DC teaching and follow up with PCP.

## 2016-06-12 NOTE — ED Provider Notes (Signed)
[facility] DEPT Provider Note   CSN: [id number] Arrival date & time: 06/12/16  1202     History   Chief Complaint Chief Complaint  Patient presents with  . Chest Pain    HPI ATHENS LEBEAU is a 66 y.o. male.  The history is provided by the patient.  Chest Pain   This is a new problem. The current episode started more than 2 days ago. The problem occurs daily. The problem has not changed since onset.The pain is associated with rest. The pain is present in the lateral region. The pain is at a severity of 2/10. The pain is mild. The quality of the pain is described as brief and dull. The pain does not radiate. Episode Length: several seconds an episode. Pertinent negatives include no abdominal pain, no back pain, no cough, no diaphoresis, no dizziness, no exertional chest pressure, no fever, no headaches, no irregular heartbeat, no leg pain, no lower extremity edema, no malaise/fatigue, no nausea, no numbness, no palpitations, no PND, no shortness of breath, no sputum production, no syncope, no vomiting and no weakness. He has tried nothing for the symptoms. The treatment provided no relief. Risk factors include male gender.  His past medical history is significant for cancer.  Pertinent negatives for past medical history include no CAD.    Past Medical History:  Diagnosis Date  . Arthritis   . Back pain   . Balance disorder    post lumbar surgery.  . Cancer ([place])    basal cell CA- removed from back  . Cataract   . Complication of anesthesia    difficullty voiding  . Degenerative disk disease   . Depression   . Headache(784.0)    no longer since cerviacl disc surgery  . Hemorrhoids   . Hyperlipidemia   . Numbness and tingling   . Sleep apnea    uses  C pap   . Spinal stenosis of lumbar region     Patient Active Problem List   Diagnosis Date Noted  . Paresthesia 12/13/2015  . Abnormality of gait 12/13/2015  . Status post thoracic spinal fusion 12/13/2015  . S/P  cervical spinal fusion 01/28/2013  . Obstructive sleep apnea 02/17/2007    Past Surgical History:  Procedure Laterality Date  . APPENDECTOMY    . CATARACT EXTRACTION     Left  . cervical disectomy  05/09/2011  . FOOT FUSION     Right great toe  . KNEE ARTHROSCOPY     x 2, bilateral  . LUMBAR DISC SURGERY    . LUMBAR LAMINECTOMY/DECOMPRESSION MICRODISCECTOMY  04/04/2011   Procedure: LUMBAR LAMINECTOMY/DECOMPRESSION MICRODISCECTOMY 2 LEVELS;  Surgeon: Eustace Moore, MD;  Location: [facility];  Service: Neurosurgery;  Laterality: N/A;  lumbar laminectomy lumbar three-four, lumbar four-five  . NASAL SEPTUM SURGERY    . POSTERIOR CERVICAL FUSION/FORAMINOTOMY N/A 01/28/2013   Procedure: POSTERIOR CERVICAL FUSION/FORAMINOTOMY CERVICAL SIX-SEVEN;  Surgeon: Eustace Moore, MD;  Location: [facility];  Service: Neurosurgery;  Laterality: N/A;  . SKIN CANCER EXCISION     2007  . thoracic vertebral decompression         Home Medications    Prior to Admission medications   Medication Sig Start Date End Date Taking? Authorizing Provider  aspirin 81 MG tablet Take 81 mg by mouth daily.   Yes Historical Provider, MD  atorvastatin (LIPITOR) 40 MG tablet Take 40 mg by mouth daily.  01/15/11  Yes Historical Provider, MD  DULoxetine (CYMBALTA) 60 MG  capsule Take 60 mg by mouth daily.   Yes Historical Provider, MD  naproxen sodium (ANAPROX) 220 MG tablet Take 440 mg by mouth 2 (two) times daily with a meal.   Yes Historical Provider, MD  Testosterone 40.5 MG/2.5GM (1.62%) GEL Apply 3 each topically at bedtime.    Yes Historical Provider, MD  vitamin A 10000 UNIT capsule Take 10,000 Units by mouth daily.   Yes Historical Provider, MD    Family History Family History  Problem Relation Age of Onset  . Heart attack Father   . Diabetes Father   . Heart disease    . Cancer Mother     Marena Chancy of type  . Sleep apnea Brother   . Heart attack Brother   . Alcohol abuse Brother   . Anesthesia  problems Neg Hx   . Colon cancer Neg Hx   . Esophageal cancer Neg Hx   . Rectal cancer Neg Hx   . Stomach cancer Neg Hx     Social History Social History  Substance Use Topics  . Smoking status: Former Smoker    Years: 8.00    Types: Cigarettes    Quit date: 03/21/1981  . Smokeless tobacco: Never Used  . Alcohol use Yes     Comment: daily glass of wine     Allergies   Betadine [povidone iodine]   Review of Systems Review of Systems  Constitutional: Negative for activity change, chills, diaphoresis, fatigue, fever and malaise/fatigue.  HENT: Negative for congestion and rhinorrhea.   Eyes: Negative for visual disturbance.  Respiratory: Negative for cough, sputum production, choking, chest tightness, shortness of breath, wheezing and stridor.   Cardiovascular: Positive for chest pain. Negative for palpitations, leg swelling, syncope and PND.  Gastrointestinal: Negative for abdominal distention, abdominal pain, blood in stool, constipation, diarrhea, nausea and vomiting.  Genitourinary: Negative for difficulty urinating, dysuria and flank pain.  Musculoskeletal: Negative for back pain and gait problem.  Skin: Negative for rash and wound.  Neurological: Negative for dizziness, weakness, light-headedness, numbness and headaches.  Psychiatric/Behavioral: Negative for agitation.  All other systems reviewed and are negative.    Physical Exam Updated Vital Signs BP (!) 162/87 (BP Location: Right Arm)   Pulse 63   Temp 97.6 F (36.4 C) (Oral)   Resp 19   SpO2 100%   Physical Exam  Constitutional: He is oriented to person, place, and time. He appears well-developed and well-nourished. No distress.  HENT:  Head: Normocephalic and atraumatic.  Right Ear: External ear normal.  Left Ear: External ear normal.  Nose: Nose normal.  Mouth/Throat: Oropharynx is clear and moist. No oropharyngeal exudate.  Eyes: Conjunctivae and EOM are normal. Pupils are equal, round, and reactive  to light.  Neck: Normal range of motion. Neck supple.  Cardiovascular: Normal rate, normal heart sounds and intact distal pulses.   No murmur heard. Pulmonary/Chest: Effort normal and breath sounds normal. No stridor. No respiratory distress. He has no wheezes. He exhibits no tenderness.  Abdominal: Soft. Bowel sounds are normal. There is no tenderness. There is no rebound and no guarding.  Musculoskeletal: He exhibits no edema or tenderness.  Neurological: He is alert and oriented to person, place, and time. He displays normal reflexes. No cranial nerve deficit. He exhibits normal muscle tone. Coordination normal.  Skin: Skin is warm. Capillary refill takes less than 2 seconds. No rash noted. He is not diaphoretic. No erythema. No pallor.  Nursing note and vitals reviewed.    ED Treatments /  Results  Labs (all labs ordered are listed, but only abnormal results are displayed) Labs Reviewed  BASIC METABOLIC PANEL - Abnormal; Notable for the following:       Result Value   Glucose, Bld 104 (*)    All other components within normal limits  CBC  D-DIMER, QUANTITATIVE (NOT AT [facility])  I-STAT TROPOININ, ED  Randolm Idol, ED    EKG  EKG Interpretation  Date/Time:  Tuesday Jun 12 2016 12:09:28 EDT Ventricular Rate:  65 PR Interval:  162 QRS Duration: 96 QT Interval:  410 QTC Calculation: 426 R Axis:   36 Text Interpretation:  Normal sinus rhythm Normal ECG When compared to prior, no significant changes.  No STEMI Confirmed by [facility] MD, [place] [phone]) on 06/12/2016 1:25:13 PM       Radiology Dg Chest 2 View  Result Date: 06/12/2016 CLINICAL DATA:  Chest pain for 2 days EXAM: CHEST  2 VIEW COMPARISON:  January 19, 2013 FINDINGS: There is no edema or consolidation. Heart size and pulmonary vascularity are normal. No adenopathy. There is postoperative change in the lower cervical and lower thoracic regions. IMPRESSION: No edema or consolidation. Electronically Signed   By:  Lowella Grip III M.D.   On: 06/12/2016 13:08    Procedures Procedures (including critical care time)  Medications Ordered in ED Medications - No data to display   Initial Impression / Assessment and Plan / ED Course  I have reviewed the triage vital signs and the nursing notes.  Pertinent labs & imaging results that were available during my care of the patient were reviewed by me and considered in my medical decision making (see chart for details).     DAESHAWN REDMANN is a 66 y.o. male With a past medical history significant for hyperlipidemia and basal cell skin cancer who presents with chest pain. Patient reports that he was sent from an urgent care after he described having chest pain for the last three days. Patient describes intermittent episodes of discomfort that are mild. He describes it as a 1 to 2 out of 10 in severity. It is a palpation like sensation on his left chest. It does not radiate. There is no associated nausea, diaphoresis, neurologic deficits, shortness of breath, syncope, or any other symptoms. Patient denies any traumas. He has not taken medicine for his symptoms. He has no history of cardiac problems with does have a strong family history of early heart disease.  History and exam are seen above. On exam, no abnormal findings. Lungs are clear and chest is nontender. Abdomen nontender.  EKG was grossly unremarkable. Troponin negative times two. D dimer negative. Other lab testing grossly unremarkable.  Chest x-ray showed no consolidation or edema. Lungs remain clear on re-examination.   Given patient's reassuring history and physical exam and workup, do not feel patient requires further testing. Suspect musculoskeletal type discomfort. As the patient has minimal symptoms, do not feel patient needs prescription for discharge. Patient will follow up with his PCP and cardiologist. Patient understood strict return precautions for any new or worsened symptoms. Patient  demonstrates ability during his ED stay. Patient discharged in good condition for further symptoms      Final Clinical Impressions(s) / ED Diagnoses   Final diagnoses:  Precordial chest pain    New Prescriptions Discharge Medication List as of 06/12/2016  3:46 PM      Clinical Impression: 1. Precordial chest pain     Disposition: Discharge  Condition: Good  I have discussed  the results, Dx and Tx plan with the pt(& family if present). He/she/they expressed understanding and agree(s) with the plan. Discharge instructions discussed at great length. Strict return precautions discussed and pt &/or family have verbalized understanding of the instructions. No further questions at time of discharge.    Discharge Medication List as of 06/12/2016  3:46 PM      Follow Up: Prince Solian, MD [place] [address] [phone]  Schedule an appointment as soon as possible for a visit    [place] [address] [id number] mc [place], MD 06/12/16 [phone]

## 2016-06-12 NOTE — ED Triage Notes (Signed)
Pt sts left sided CP worse with inspiration x 3 days

## 2016-06-12 NOTE — ED Notes (Signed)
ED Provider at bedside. 

## 2016-06-12 NOTE — ED Notes (Signed)
Pt reports he "does not feel bad but something does not feel right from time to time".

## 2017-04-12 ENCOUNTER — Encounter: Payer: Self-pay | Admitting: Internal Medicine

## 2017-04-12 ENCOUNTER — Ambulatory Visit: Payer: Medicare Other | Admitting: Internal Medicine

## 2017-04-12 DIAGNOSIS — G4733 Obstructive sleep apnea (adult) (pediatric): Secondary | ICD-10-CM

## 2017-04-12 NOTE — Progress Notes (Signed)
HPI male former smoker followed for OSA, complicated by arthritis in spine complicated by arthritis, degenerative disc disease NPSG 04/04/05 AHI 13/ hr, desaturation to 88%  ---------------------------------------------------------------------------------------------  3/98//5162- 67 year old male former smoker followed for OSA, complicated by arthritis in spine VPAP 17 Insp 12 Exp /  Advanced   He sets his own pressure FOLLOWS FOR: DME: [facility]. Pt VPAP every night and DL attached. No issues with pressure; pt does not need supplies at this time. His machine is at least 68 years old and we discussed how to check with his DME company to see if he is eligible for replacement. Download confirms 90% 4 hour compliance, AHI 5.2/hour. He feels well rested with reports of snoring.  04/12/17- 67 year old male former smoker followed for OSA, complicated by arthritis in spine VPAP 17 Insp, 12 Exp /  Advanced   He sets his own pressure ----OSA: DME: [facility]. Pt wears VPAP every night for at least 8 hours. No new supplies needed at this time.  He did not bring download this time but reports AHI 3.1/hour. We discussed compliance, difference between apneas and hypopneas, comfort goals.  He would be responsible for 20% of cost of new machine.  Since his current machine works well and he does have an old backup CPAP machine, he chooses not to replace for now. Some residual daytime tiredness, much better than before CPAP.  We discussed naps, adequate sleep, caffeine.  ROS-see HPI   + = positive Constitutional:   No-   weight loss, night sweats, fevers, chills, fatigue, lassitude. HEENT:   No-  headaches, difficulty swallowing, tooth/dental problems, sore throat,       No-  sneezing, itching, ear ache, nasal congestion, post nasal drip,  CV:  No-   chest pain, orthopnea, PND, swelling in lower extremities, anasarca, dizziness, palpitations Resp: No-   shortness of breath with exertion or at rest.              No-    productive cough,  No non-productive cough,  No- coughing up of blood.              No-   change in color of mucus.  No- wheezing.   Skin: No-   rash or lesions. GI:  No-   heartburn, indigestion, abdominal pain, nausea, vomiting,  GU:. MS:  No-   joint pain or swelling.  + Chronic back pain. Neuro-     nothing unusual Psych:  No- change in mood or affect. No depression or anxiety.  No memory loss.  OBJ General- Alert, Oriented, Affect-appropriate, Distress- none acute, tall, trim, comfortable appearing. Skin- rash-none, lesions- none, excoriation- none Lymphadenopathy- none Head- atraumatic            Eyes- Gross vision intact, PERRLA, conjunctivae clear secretions            Ears- Hearing, canals-normal            Nose- Clear, no-Septal dev, mucus, polyps, erosion, perforation             Throat- Mallampati II-III , mucosa clear , drainage- none, tonsils- atrophic Neck- flexible , trachea midline, no stridor , thyroid nl, carotid no bruit Chest - symmetrical excursion , unlabored           Heart/CV- RRR , no murmur , no gallop  , no rub, nl s1 s2                           -  JVD- none , edema- none, stasis changes- none, varices- none           Lung- clear to P&A, wheeze- none, cough- none , dullness-none, rub- none           Chest wall-  Abd- Br/ Gen/ Rectal- Not done, not indicated Extrem- cyanosis- none, clubbing, none, atrophy- none, strength- nl. Moves stiffly Neuro- grossly intact to observation

## 2017-04-12 NOTE — Assessment & Plan Note (Signed)
He continues to describe excellent compliance and control.  He has a little residual tiredness noted if sitting quietly which may be acceptable at age 67.  It does not interfere with driving or other life issues.

## 2017-04-12 NOTE — Patient Instructions (Addendum)
We can continue VPAP 17, Insp 12, Exp/ mask of choice, supplies, humidifier, download capability   Please call if we can help

## 2017-12-25 ENCOUNTER — Telehealth: Payer: Self-pay | Admitting: Internal Medicine

## 2017-12-25 DIAGNOSIS — G4733 Obstructive sleep apnea (adult) (pediatric): Secondary | ICD-10-CM

## 2017-12-25 NOTE — Telephone Encounter (Signed)
Called and spoke with patient advised I sent the order in for new machine. Patient verbalized understanding. Nothing further needed.

## 2017-12-25 NOTE — Telephone Encounter (Signed)
Called and spoke with patient he stated that he is needing a new Vpap machine. His is 67 years old and is no longer working.   CY please advise, thank you.

## 2017-12-25 NOTE — Telephone Encounter (Signed)
Ok to replace old VPAP machine Insp 17, Exp 12, mask of choice, hoses, filters and supplies, humidifier, AirView

## 2018-04-13 ENCOUNTER — Encounter: Payer: Self-pay | Admitting: Internal Medicine

## 2018-04-14 ENCOUNTER — Encounter: Payer: Self-pay | Admitting: Internal Medicine

## 2018-04-14 ENCOUNTER — Ambulatory Visit: Payer: Medicare Other | Admitting: Internal Medicine

## 2018-04-14 VITALS — BP 142/82 | HR 62 | Ht 74.5 in | Wt 206.6 lb

## 2018-04-14 DIAGNOSIS — Z981 Arthrodesis status: Secondary | ICD-10-CM

## 2018-04-14 DIAGNOSIS — G4733 Obstructive sleep apnea (adult) (pediatric): Secondary | ICD-10-CM | POA: Diagnosis not present

## 2018-04-14 NOTE — Assessment & Plan Note (Signed)
At this point he is not expecting to need further spine surgery.  He says back and neck pain are not interfering with his sleep quality now.

## 2018-04-14 NOTE — Assessment & Plan Note (Signed)
He definitely benefits from CPAP with improved sleep.  There is still some residual daytime sleepiness and he may eventually need a prescription medication.  We have discussed sleep hygiene, caffeine, naps, driving responsibility. Plan-continue VPAP 22/17

## 2018-04-14 NOTE — Progress Notes (Signed)
HPI male former smoker followed for OSA, complicated by arthritis in spine  degenerative disc disease/ fusion NPSG 04/04/05 AHI 13/ hr, desaturation to 88%  --------------------------------------------------------------------------------------------- 04/12/17- 68 year old male former smoker followed for OSA, complicated by arthritis in spine VPAP 17 Insp, 12 Exp /  Advanced   He sets his own pressure ----OSA: DME: [facility]. Pt wears VPAP every night for at least 8 hours. No new supplies needed at this time.  He did not bring download this time but reports AHI 3.1/hour. We discussed compliance, difference between apneas and hypopneas, comfort goals.  He would be responsible for 20% of cost of new machine.  Since his current machine works well and he does have an old backup CPAP machine, he chooses not to replace for now. Some residual daytime tiredness, much better than before CPAP.  We discussed naps, adequate sleep, caffeine.  04/14/2018- 68 year old male former smoker followed for OSA, complicated by arthritis in spine, degenerative disk disease/ fusion VPAP 22 Insp, 17 Exp /  Advanced   He sets his own pressure Download 100% compliance AHI 3.3/hour -----OSA; breathing as baseline; recent change in CPAP machine, recently changed settings to 22 & 17 Replacement machine last year and reset the pressures center.  He says this is very comfortable and he thinks he sleeps well with nocturia x1.  He still tends to get very sleepy if he sits quietly and relaxing.  Discussed naps, use of caffeine and availability of prescription stimulants if needed.  He feels this problem is much better than before he began using CPAP.  This sleepiness does not affect his driving or quality of life issues.  ROS-see HPI   + = positive Constitutional:   No-   weight loss, night sweats, fevers, chills,+ fatigue, lassitude. HEENT:   No-  headaches, difficulty swallowing, tooth/dental problems, sore throat,       No-  sneezing,  itching, ear ache, nasal congestion, post nasal drip,  CV:  No-   chest pain, orthopnea, PND, swelling in lower extremities, anasarca, dizziness, palpitations Resp: No-   shortness of breath with exertion or at rest.              No-   productive cough,  No non-productive cough,  No- coughing up of blood.              No-   change in color of mucus.  No- wheezing.   Skin: No-   rash or lesions. GI:  No-   heartburn, indigestion, abdominal pain, nausea, vomiting,  GU:. MS:  No-   joint pain or swelling.  + Chronic back pain. Neuro-     nothing unusual Psych:  No- change in mood or affect. No depression or anxiety.  No memory loss.  OBJ General- Alert, Oriented, Affect-appropriate, Distress- none acute, tall, trim, comfortable appearing. Skin- rash-none, lesions- none, excoriation- none Lymphadenopathy- none Head- atraumatic            Eyes- Gross vision intact, PERRLA, conjunctivae clear secretions            Ears- Hearing, canals-normal            Nose- Clear, no-Septal dev, mucus, polyps, erosion, perforation             Throat- Mallampati II-III , mucosa clear , drainage- none, tonsils- atrophic Neck- flexible , trachea midline, no stridor , thyroid nl, carotid no bruit Chest - symmetrical excursion , unlabored  Heart/CV- RRR , no murmur , no gallop  , no rub, nl s1 s2                           - JVD- none , edema- none, stasis changes- none, varices- none           Lung- clear to P&A, wheeze- none, cough- none , dullness-none, rub- none           Chest wall-  Abd- Br/ Gen/ Rectal- Not done, not indicated Extrem- cyanosis- none, clubbing, none, atrophy- none, strength- nl. Moves stiffly Neuro- grossly intact to observation

## 2018-04-14 NOTE — Patient Instructions (Signed)
We can continue VPAP 22/ 17, mask of choice, humidifier, supplies, AirView

## 2018-04-25 IMAGING — XA DG MYELOGRAPHY LUMBAR INJ THORACIC
12 of 13 series · 12 of 13 positions shown · non-contrast
Comparison: Thoracic spine MRI 11/18/2015 and myelogram 09/19/2012

CLINICAL DATA: Lower extremity numbness and spasticity.
TECHNIQUE: Contiguous axial images were obtained through the Thoracic spine
after the intrathecal infusion of infusion. Coronal and sagittal
reconstructions were obtained of the axial image sets.

[Series 1: ortho standard · 1 of 1 slices shown (1 of 2)]
[im 1/1]
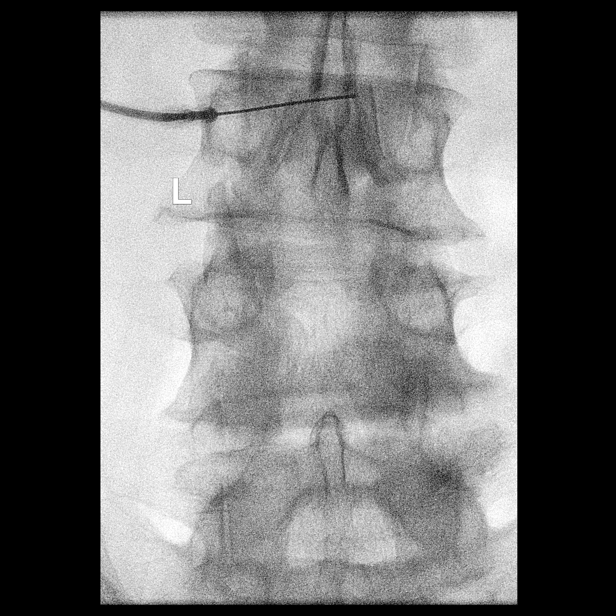

[Series 2: ortho standard · 1 of 1 slices shown (2 of 2)]
[im 1/1]
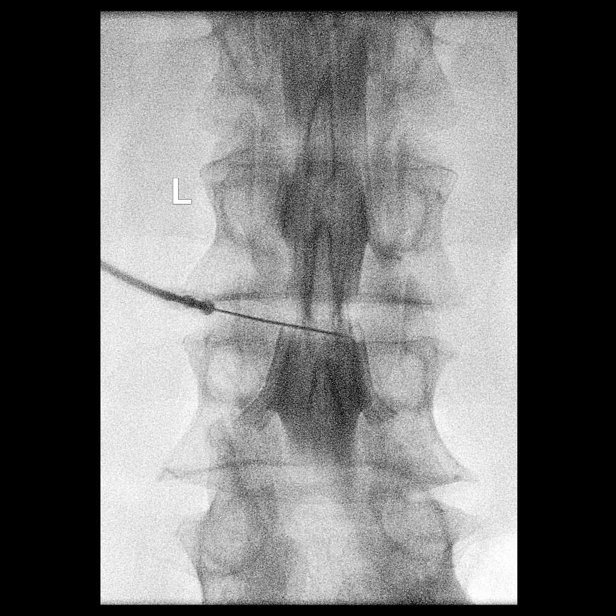

[Series 3: vasc standard · 1 of 1 slices shown (1 of 10)]
[im 1/1]
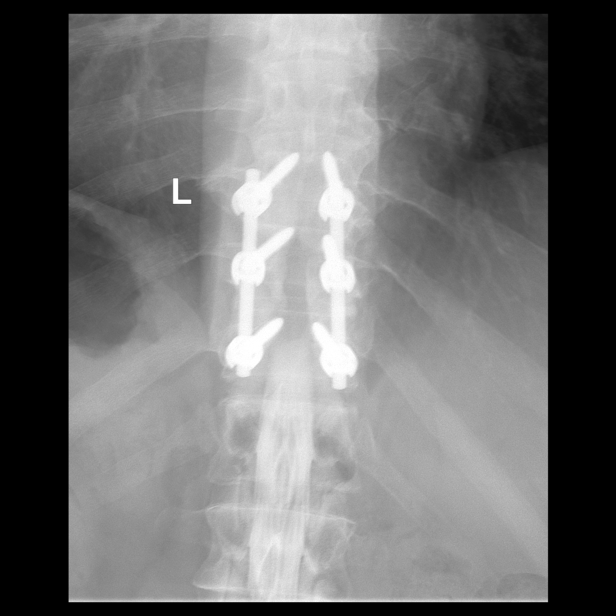

[Series 4: vasc standard · 1 of 1 slices shown (2 of 10)]
[im 1/1]
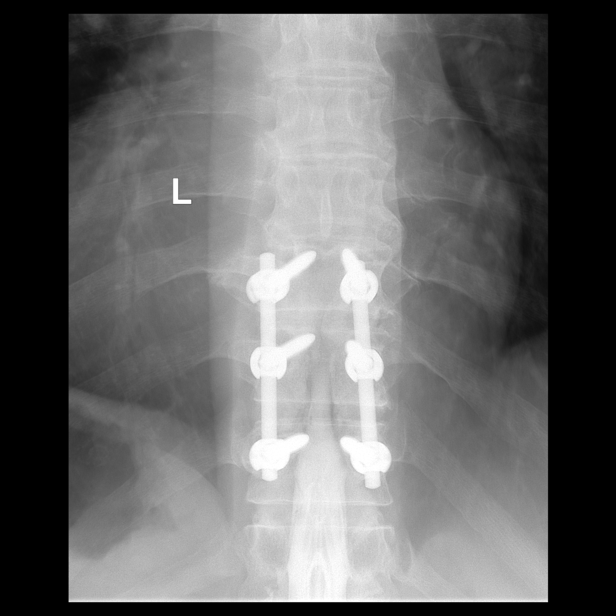

[Series 5: vasc standard · 1 of 1 slices shown (3 of 10)]
[im 1/1]
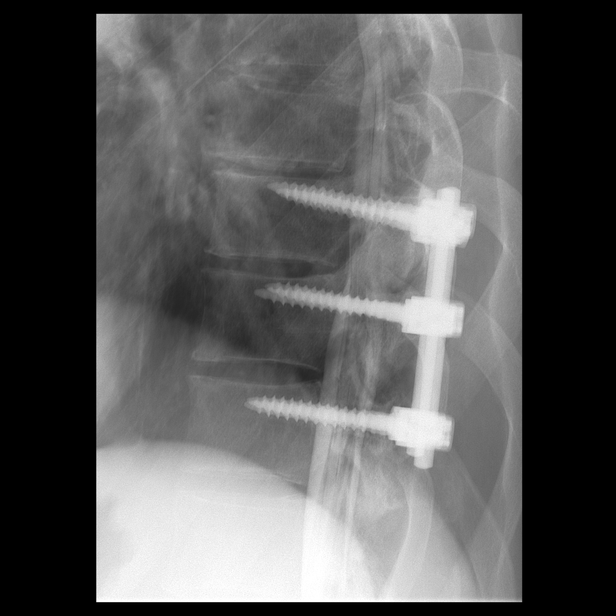

[Series 6: vasc standard · 1 of 1 slices shown (4 of 10)]
[im 1/1]
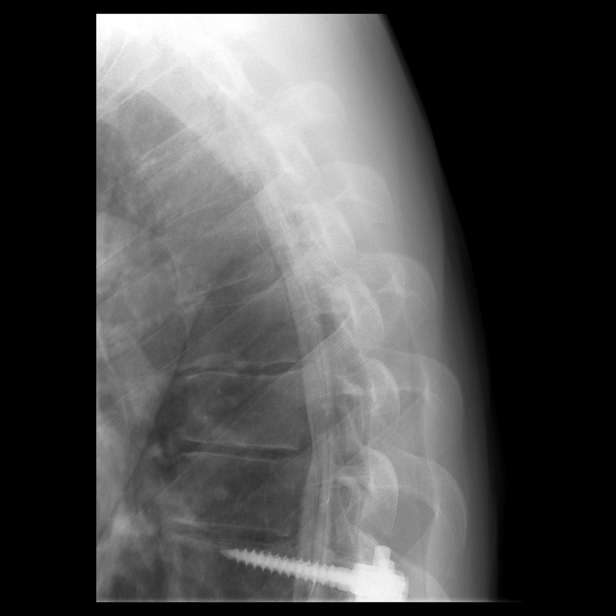

[Series 8: vasc standard · 1 of 1 slices shown (5 of 10)]
[im 1/1]
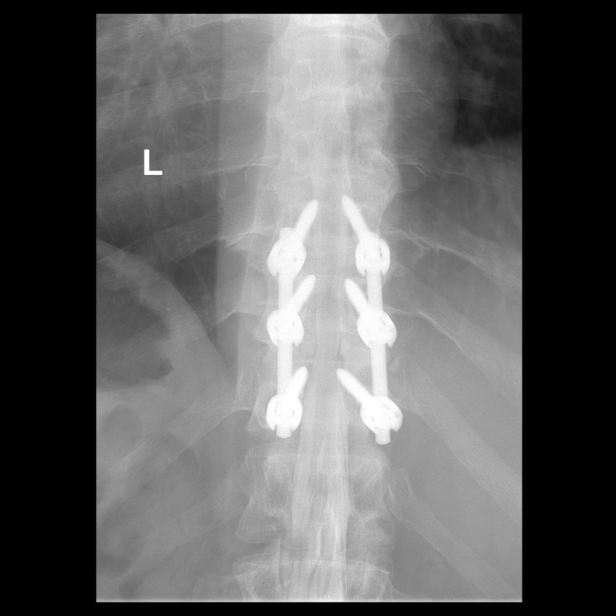

[Series 9: vasc standard · 1 of 1 slices shown (6 of 10)]
[im 1/1]
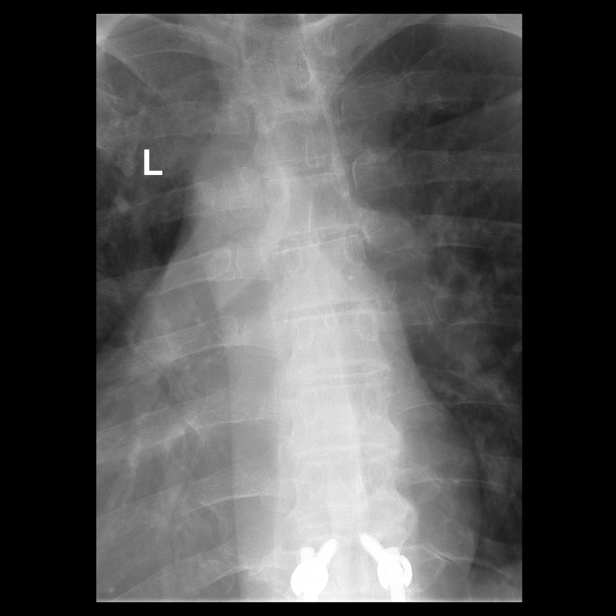

[Series 10: vasc standard · 1 of 1 slices shown (7 of 10)]
[im 1/1]
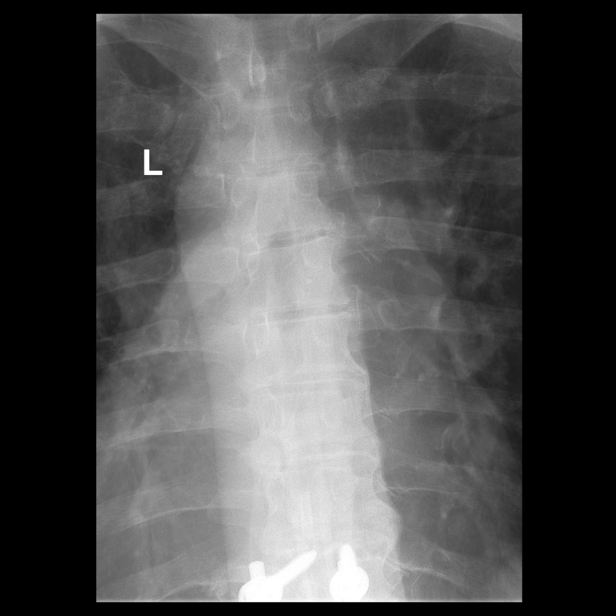

[Series 11: vasc standard · 1 of 1 slices shown (8 of 10)]
[im 1/1]
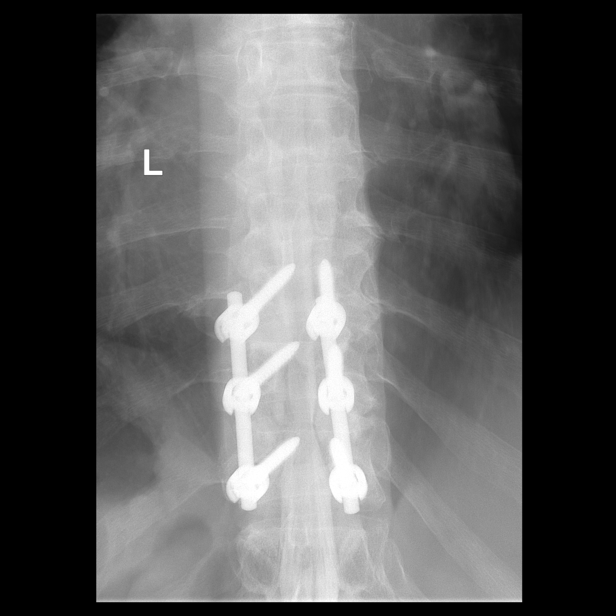

[Series 12: vasc standard · 1 of 1 slices shown (9 of 10)]
[im 1/1]
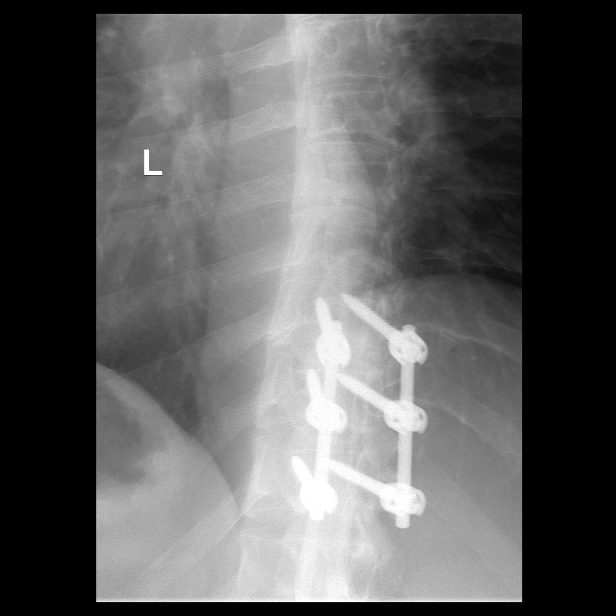

[Series 13: vasc standard · 1 of 1 slices shown (10 of 10)]
[im 1/1]
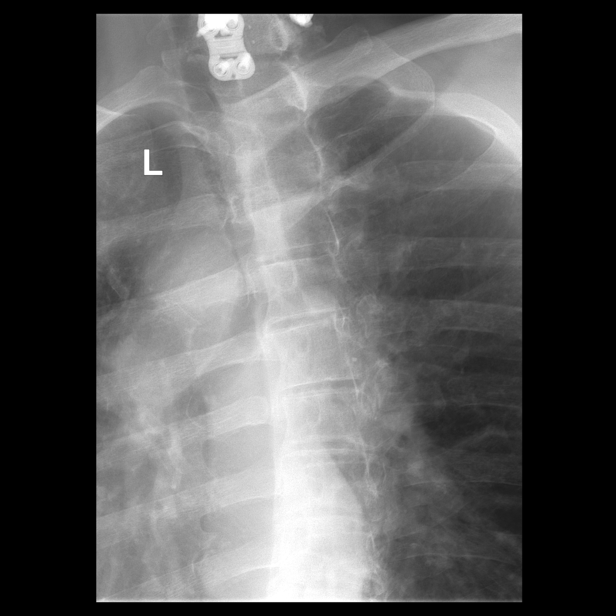

[12 of 13 positions shown; findings below may reference images not displayed]

FLUOROSCOPY TIME:  Radiation Exposure Index (as provided by the
fluoroscopic device): 259.52 microGray*m^2

Fluoroscopy Time (in minutes and seconds):  29 seconds

PROCEDURE:
LUMBAR PUNCTURE FOR THORACIC MYELOGRAM

After thorough discussion of risks and benefits of the procedure
including bleeding, infection, injury to nerves, blood vessels,
adjacent structures as well as headache and CSF leak, written and
oral informed consent was obtained. Consent was obtained by Dr.
Jaylon Aujla.

Patient was positioned prone on the fluoroscopy table. Local
anesthesia was provided with 1% lidocaine without epinephrine after
prepped and draped in the usual sterile fashion. Puncture was
performed at L2-3 using a 3 1/2 inch 22-gauge spinal needle via a
left interlaminar approach. Using a single pass through the dura,
the needle was placed within the thecal sac, with return of clear
CSF. 10 mL of Isovue M 300 was injected into the thecal sac, with
normal opacification of the nerve roots and cauda equina consistent
with free flow within the subarachnoid space. The patient was then
moved to the trendelenburg position and contrast flowed into the
Thoracic spine region.

I personally performed the lumbar puncture and administered the
intrathecal contrast. I also personally supervised acquisition of
the myelogram images.
FINDINGS: THORACIC MYELOGRAM FINDINGS:

Normal vertebral alignment. Prior T9-T11 posterior fusion. Ventral
extradural defect at T7-8 with likely mild spinal stenosis. Ventral
and dorsal extradural defects at T9-10 with at least moderate spinal
stenosis.

CT THORACIC MYELOGRAM FINDINGS:

Vertebral alignment is normal. Sequelae of C5-C7 ACDF are partially
visualized, with solid osseous fusion at both levels. Sequelae of
T9-T11 posterior fusion are again identified. There is posterior
element ankylosis bilaterally at T9-10 without solid osseous fusion
seen at T10-11. There is a Schmorl's node type deformity involving
the T9 superior endplate which is slightly enlarged from 1086 and
with mildly increased lucency partially about the tip of the
adjacent left pedicle screw. No evidence of screw loosening
elsewhere. T6 and T7 inferior endplate Schmorl's nodes are
unchanged. There is distortion of the spinal cord at the T9-10 level
from spinal stenosis as well as likely myelomalacia has demonstrated
on recent MRI. 3 mm right upper lobe lung nodule is unchanged from
the prior myelogram (series 5, image 29) and considered benign.

C7-T1: Moderate left facet arthrosis without significant stenosis,
unchanged.

T1-2: Mild-to-moderate right and mild left facet arthrosis without
stenosis, unchanged.

T2-3: Mild right and mild-to-moderate left facet arthrosis without
stenosis, unchanged.

T3-4: Small left central disc protrusion without stenosis,
unchanged.

T4-5: Mild right facet arthrosis, mildly progressed from 1086. No
stenosis.

T5-6:  Mild to moderate facet arthrosis without stenosis, unchanged.

T6-7:  Negative.

T7-8: Moderate-sized, partially calcified central disc extrusion
results in mild ventral cord flattening and mild spinal stenosis,
unchanged.

T8-9:  Tiny central disc protrusion without stenosis, unchanged.

T9-10: Prior laminectomy and posterior fusion again noted. Central
disc extrusion, facet hypertrophy, and ligamentous ossification
result in moderate to severe spinal stenosis with cord compression,
slightly progressed from 1086. Mild bilateral neural foraminal
stenosis is unchanged.

T10-11: Prior laminectomy and fusion again noted. Facet hypertrophy
and spurring result in mild residual transverse narrowing of the
spinal canal and mild bilateral neural foraminal stenosis,
unchanged.

T11-12:  Minimal facet arthrosis without stenosis, unchanged.

T12-L1: Mild to moderate right facet arthrosis without stenosis,
unchanged.
IMPRESSION: 1. Prior laminectomies and posterior fusion T9-T11. Moderate to
severe multifactorial spinal stenosis at T9-10, mildly progressed
from 1086.
2. Mild residual transverse spinal canal narrowing at T10-11,
unchanged.
3. T7-8 disc extrusion with mild spinal stenosis and mild cord
flattening, unchanged.

## 2019-01-26 ENCOUNTER — Ambulatory Visit: Payer: Self-pay

## 2019-01-26 ENCOUNTER — Other Ambulatory Visit: Payer: Self-pay

## 2019-01-26 ENCOUNTER — Ambulatory Visit: Payer: Medicare Other | Admitting: Orthopaedic Surgery

## 2019-01-26 ENCOUNTER — Telehealth: Payer: Self-pay

## 2019-01-26 DIAGNOSIS — M1612 Unilateral primary osteoarthritis, left hip: Secondary | ICD-10-CM | POA: Diagnosis not present

## 2019-01-26 DIAGNOSIS — M25562 Pain in left knee: Secondary | ICD-10-CM

## 2019-01-26 DIAGNOSIS — M1712 Unilateral primary osteoarthritis, left knee: Secondary | ICD-10-CM

## 2019-01-26 DIAGNOSIS — G8929 Other chronic pain: Secondary | ICD-10-CM | POA: Diagnosis not present

## 2019-01-26 DIAGNOSIS — M25561 Pain in right knee: Secondary | ICD-10-CM

## 2019-01-26 DIAGNOSIS — M25552 Pain in left hip: Secondary | ICD-10-CM | POA: Diagnosis not present

## 2019-01-26 DIAGNOSIS — M1711 Unilateral primary osteoarthritis, right knee: Secondary | ICD-10-CM

## 2019-01-26 MED ORDER — METHYLPREDNISOLONE ACETATE 40 MG/ML IJ SUSP
40.0000 mg | INTRAMUSCULAR | Status: AC | PRN
  Administered 2019-01-26: 09:00:00 40 mg via INTRA_ARTICULAR

## 2019-01-26 MED ORDER — LIDOCAINE HCL 1 % IJ SOLN
3.0000 mL | INTRAMUSCULAR | Status: AC | PRN
  Administered 2019-01-26: 3 mL

## 2019-01-26 NOTE — Telephone Encounter (Signed)
Bilateral knee gel injections 

## 2019-01-26 NOTE — Telephone Encounter (Signed)
Yes, please order these for both knees

## 2019-01-26 NOTE — Progress Notes (Signed)
Office Visit Note   Patient: [name]           Date of Birth: 01-07-1951           MRN: [id number] Visit Date: 01/26/2019              Requested by: Prince Solian, MD [address] [place],  [place] [address] PCP: Prince Solian, MD   Assessment & Plan: Visit Diagnoses:  1. Pain in left hip   2. Chronic pain of both knees   3. Unilateral primary osteoarthritis, left knee   4. Unilateral primary osteoarthritis, right knee   5. Unilateral primary osteoarthritis, left hip     Plan: We went over his x-rays in detail with his left hip and bilateral knees.  He wants to try to stay conservative and I agree with this treatment plan for now.  We will set him up for outpatient physical therapy to work with strengthening muscles around the left hip and both his knees.  This should only be a few visits with transitioning him to a home exercise program and helping him understand what are the things he can do to try to maintain his joints.  I did place a steroid injection in both knees today and we will set him up for an intra-articular steroid injection in his left hip under direct fluoroscopy by Dr. Ernestina Patches.  I also feel that he is a candidate for hyaluronic acid injections for both knees and will order this for him.  I gave him a handout about this.  We had a long thorough discussion about his joints.  All question concerns were answered and addressed.  I will see him back in 4 weeks.  Follow-Up Instructions: Return in about 4 weeks (around 02/23/2019).   Orders:  Orders Placed This Encounter  Procedures  . Large Joint Inj  . Large Joint Inj  . XR HIP UNILAT W OR W/O PELVIS 1V LEFT  . XR Knee 1-2 Views Left  . XR Knee 1-2 Views Right   No orders of the defined types were placed in this encounter.     Procedures: Large Joint Inj: R knee on 01/26/2019 9:11 AM Indications: diagnostic evaluation and pain Details: 22 G 1.5 in needle, superolateral approach  Arthrogram:  No  Medications: 3 mL lidocaine 1 %; 40 mg methylPREDNISolone acetate 40 MG/ML Outcome: tolerated well, no immediate complications Procedure, treatment alternatives, risks and benefits explained, specific risks discussed. Consent was given by the patient. Immediately prior to procedure a time out was called to verify the correct patient, procedure, equipment, support staff and site/side marked as required. Patient was prepped and draped in the usual sterile fashion.   Large Joint Inj: L knee on 01/26/2019 9:11 AM Indications: diagnostic evaluation and pain Details: 22 G 1.5 in needle, superolateral approach  Arthrogram: No  Medications: 3 mL lidocaine 1 %; 40 mg methylPREDNISolone acetate 40 MG/ML Outcome: tolerated well, no immediate complications Procedure, treatment alternatives, risks and benefits explained, specific risks discussed. Consent was given by the patient. Immediately prior to procedure a time out was called to verify the correct patient, procedure, equipment, support staff and site/side marked as required. Patient was prepped and draped in the usual sterile fashion.       Clinical Data: No additional findings.   Subjective: Chief Complaint  Patient presents with  . Left Hip - Pain  . Right Knee - Pain  . Left Knee - Pain  The patient comes in today  for evaluation and treatment for bilateral knee pain as well as left hip pain.  His left hip hurts in the groin.  Both knees are the medial aspect of both knees.  His left hip pain started after the end of November when playing golf.  He does golf a lot and walks a lot with cough the pains in the groin.  Is not a constant pain but it does hurt with pivoting activities and hurts with weightbearing.  Both knees have been hurting for a long period of time.  He has varus malalignment just on looking at him with both knees.  He is not a diabetic.  He is a very active 68 year old gentleman.  He has had multiple spine surgeries in  the past.  He used to swim but has not been able to swim since COVID-19 pandemic.  He does report his pain being daily and at this point is detriment affecting his mobility, his quality of life and his actives daily living.  HPI  Review of Systems He currently denies any headache, chest pain, shortness of breath, fever, chills, nausea, vomiting  Objective: Vital Signs: There were no vitals taken for this visit.  Physical Exam Is alert and orient x3 and in no acute distress Ortho Exam Examination of both knees shows varus malalignment.  Both knees have patellofemoral crepitation and medial joint line tenderness.  Both knees have no effusion and her ligamentously stable with full range of motion.  Examination of his left hip shows significant stiffness with internal and external rotation and severe pain in the groin with rotation. Specialty Comments:  No specialty comments available.  Imaging: XR HIP UNILAT W OR W/O PELVIS 1V LEFT  Result Date: 01/26/2019 An AP pelvis and lateral left hip shows severe end-stage arthritis left hip with joint space narrowing, sclerotic changes, cystic changes and periarticular osteophytes.  XR Knee 1-2 Views Left  Result Date: 01/26/2019 3 views left knee show significant tricompartment arthritic changes with medial joint space narrowing and patellofemoral disease as well as varus malalignment.  XR Knee 1-2 Views Right  Result Date: 01/26/2019 An AP and lateral of the right knee shows significant tricompartment arthritis with varus malalignment, significant medial joint space narrowing and patellofemoral arthritic changes.    PMFS History: Patient Active Problem List   Diagnosis Date Noted  . Unilateral primary osteoarthritis, left knee 01/26/2019  . Unilateral primary osteoarthritis, right knee 01/26/2019  . Unilateral primary osteoarthritis, left hip 01/26/2019  . Paresthesia 12/13/2015  . Abnormality of gait 12/13/2015  . Status post  thoracic spinal fusion 12/13/2015  . S/P cervical spinal fusion 01/28/2013  . Obstructive sleep apnea 02/17/2007   Past Medical History:  Diagnosis Date  . Arthritis   . Back pain   . Balance disorder    post lumbar surgery.  . Cancer ([place])    basal cell CA- removed from back  . Cataract   . Complication of anesthesia    difficullty voiding  . Degenerative disk disease   . Depression   . Headache(784.0)    no longer since cerviacl disc surgery  . Hemorrhoids   . Hyperlipidemia   . Numbness and tingling   . Sleep apnea    uses  C pap   . Spinal stenosis of lumbar region     Family History  Problem Relation Age of Onset  . Heart attack Father   . Diabetes Father   . Heart disease Unknown   . Cancer Mother  Unsure of type  . Sleep apnea Brother   . Heart attack Brother   . Alcohol abuse Brother   . Anesthesia problems Neg Hx   . Colon cancer Neg Hx   . Esophageal cancer Neg Hx   . Rectal cancer Neg Hx   . Stomach cancer Neg Hx     Past Surgical History:  Procedure Laterality Date  . APPENDECTOMY    . CATARACT EXTRACTION     Left  . cervical disectomy  05/09/2011  . FOOT FUSION     Right great toe  . KNEE ARTHROSCOPY     x 2, bilateral  . LUMBAR DISC SURGERY    . LUMBAR LAMINECTOMY/DECOMPRESSION MICRODISCECTOMY  04/04/2011   Procedure: LUMBAR LAMINECTOMY/DECOMPRESSION MICRODISCECTOMY 2 LEVELS;  Surgeon: Eustace Moore, MD;  Location: [facility];  Service: Neurosurgery;  Laterality: N/A;  lumbar laminectomy lumbar three-four, lumbar four-five  . NASAL SEPTUM SURGERY    . POSTERIOR CERVICAL FUSION/FORAMINOTOMY N/A 01/28/2013   Procedure: POSTERIOR CERVICAL FUSION/FORAMINOTOMY CERVICAL SIX-SEVEN;  Surgeon: Eustace Moore, MD;  Location: [facility];  Service: Neurosurgery;  Laterality: N/A;  . SKIN CANCER EXCISION     2007  . thoracic vertebral decompression     Social History   Occupational History  . Occupation: Retired-due to back pain    Employer:  [employer]    Comment: Clinical biochemist  Tobacco Use  . Smoking status: Former Smoker    Years: 8.00    Types: Cigarettes    Quit date: 03/21/1981    Years since quitting: 37.8  . Smokeless tobacco: Never Used  Substance and Sexual Activity  . Alcohol use: Yes    Comment: daily glass of wine  . Drug use: No  . Sexual activity: Yes

## 2019-01-26 NOTE — Telephone Encounter (Signed)
Noted.  Will submit for benefits on 02/17/2019.

## 2019-01-26 NOTE — Telephone Encounter (Signed)
I meant to send this to you not Ninfa Linden

## 2019-02-12 ENCOUNTER — Ambulatory Visit: Payer: Medicare Other | Attending: Orthopaedic Surgery | Admitting: Physical Therapy

## 2019-02-12 ENCOUNTER — Other Ambulatory Visit: Payer: Self-pay

## 2019-02-12 ENCOUNTER — Encounter: Payer: Self-pay | Admitting: Physical Therapy

## 2019-02-12 DIAGNOSIS — M25562 Pain in left knee: Secondary | ICD-10-CM | POA: Insufficient documentation

## 2019-02-12 DIAGNOSIS — M25561 Pain in right knee: Secondary | ICD-10-CM | POA: Diagnosis present

## 2019-02-12 DIAGNOSIS — G8929 Other chronic pain: Secondary | ICD-10-CM | POA: Diagnosis present

## 2019-02-12 DIAGNOSIS — M25552 Pain in left hip: Secondary | ICD-10-CM | POA: Insufficient documentation

## 2019-02-12 DIAGNOSIS — M6281 Muscle weakness (generalized): Secondary | ICD-10-CM | POA: Insufficient documentation

## 2019-02-12 NOTE — Patient Instructions (Signed)
Access Code: [email or web address]  URL: [email or web address]  Date: 02/12/2019  Prepared by: Hilda Blades   Exercises Supine Active Straight Leg Raise - 15 reps - 3 sets - 1x daily Clamshell with Resistance - 10 reps - 3 sets - 1x daily Seated Hamstring Stretch - 3 reps - 20 seconds hold - 1x daily Sidelying Thoracic Lumbar Rotation - 10 reps - 1x daily

## 2019-02-12 NOTE — Therapy (Signed)
[place], [place], [address] Phone: [phone]   Fax:  [phone]  Physical Therapy Evaluation  Patient Details  Name: [name] MRN: [id number] Date of Birth: 1950/06/15 Referring Provider (PT): Mcarthur Rossetti, MD   Encounter Date: 02/12/2019  PT End of Session - 02/12/19 1127    Visit Number  1    Number of Visits  6    Date for PT Re-Evaluation  04/09/19    Authorization Type  U[employer] MCR    PT Start Time  1130    PT Stop Time  1215    PT Time Calculation (min)  45 min    Activity Tolerance  Patient tolerated treatment well    Behavior During Therapy  [facility] for tasks assessed/performed       Past Medical History:  Diagnosis Date  . Arthritis   . Back pain   . Balance disorder    post lumbar surgery.  . Cancer ([place])    basal cell CA- removed from back  . Cataract   . Complication of anesthesia    difficullty voiding  . Degenerative disk disease   . Depression   . Headache(784.0)    no longer since cerviacl disc surgery  . Hemorrhoids   . Hyperlipidemia   . Numbness and tingling   . Sleep apnea    uses  C pap   . Spinal stenosis of lumbar region     Past Surgical History:  Procedure Laterality Date  . APPENDECTOMY    . CATARACT EXTRACTION     Left  . cervical disectomy  05/09/2011  . FOOT FUSION     Right great toe  . KNEE ARTHROSCOPY     x 2, bilateral  . LUMBAR DISC SURGERY    . LUMBAR LAMINECTOMY/DECOMPRESSION MICRODISCECTOMY  04/04/2011   Procedure: LUMBAR LAMINECTOMY/DECOMPRESSION MICRODISCECTOMY 2 LEVELS;  Surgeon: Eustace Moore, MD;  Location: [facility];  Service: Neurosurgery;  Laterality: N/A;  lumbar laminectomy lumbar three-four, lumbar four-five  . NASAL SEPTUM SURGERY    . POSTERIOR CERVICAL FUSION/FORAMINOTOMY N/A 01/28/2013   Procedure: POSTERIOR CERVICAL FUSION/FORAMINOTOMY CERVICAL SIX-SEVEN;  Surgeon: Eustace Moore, MD;  Location: [facility];   Service: Neurosurgery;  Laterality: N/A;  . SKIN CANCER EXCISION     2007  . thoracic vertebral decompression      There were no vitals filed for this visit.   Subjective Assessment - 02/12/19 1125    Subjective  Patient reports both hips and both knees are bone on bone. He notes that his left hip gives him the most trouble especially when he is playing golf. He also has times when his left leg will want to give way. He also notes that when he stands from a sitting position it takes him a bit to start walking and moving.    Pertinent History  Multiple thoracic and cervical surgeries    Limitations  Standing;House hold activities;Other (comment)   Golfing   How long can you sit comfortably?  No limitation    How long can you stand comfortably?  No limitation    How long can you walk comfortably?  No limitation    Diagnostic tests  X-ray    Patient Stated Goals  Improved his walking and golfing ability.    Currently in Pain?  Yes    Pain Score  0-No pain    Pain Location  Hip    Pain Orientation  Left  Pain Descriptors / Indicators  Aching;Sharp    Pain Type  Chronic pain    Pain Radiating Towards  groin area    Pain Onset  More than a month ago    Pain Frequency  Intermittent    Aggravating Factors   Golfing, walking, stairs    Pain Relieving Factors  Rest, medication    Effect of Pain on Daily Activities  Patient is limited in walking and golfing    Multiple Pain Sites  Yes    Pain Score  2    Pain Location  Knee    Pain Orientation  Right;Left    Pain Descriptors / Indicators  Aching    Pain Type  Chronic pain    Pain Onset  More than a month ago    Pain Frequency  Intermittent    Aggravating Factors   Walking, stairs, standing    Pain Relieving Factors  Rest, medication    Effect of Pain on Daily Activities  Limited in walking ability         [facility] PT Assessment - 02/12/19 0001      Assessment   Medical Diagnosis  Chronic bilateral knee and left hip pain     Referring Provider (PT)  Mcarthur Rossetti, MD    Onset Date/Surgical Date  --   many years, left hip more recent   Hand Dominance  Right    Next MD Visit  Not scheduled    Prior Therapy  No      Precautions   Precautions  None      Restrictions   Weight Bearing Restrictions  No      Balance Screen   Has the patient fallen in the past 6 months  No    Has the patient had a decrease in activity level because of a fear of falling?   No    Is the patient reluctant to leave their home because of a fear of falling?   No      Prior Function   Level of Independence  Independent with basic ADLs    Leisure  Golf      Cognition   Overall Cognitive Status  Within Functional Limits for tasks assessed      Observation/Other Assessments   Observations  Patient appears in no apparent distress    Focus on Therapeutic Outcomes (FOTO)   37% limitation      Sensation   Light Touch  Appears Intact      ROM / Strength   AROM / PROM / Strength  AROM;PROM;Strength      AROM   AROM Assessment Site  Knee    Right/Left Knee  Right;Left    Right Knee Extension  -2    Right Knee Flexion  130    Left Knee Extension  -2    Left Knee Flexion  125      PROM   Overall PROM Comments  Hip PROM grossly WFL except:    PROM Assessment Site  Knee;Hip    Right/Left Hip  Right;Left    Right Hip Internal Rotation   10    Left Hip Internal Rotation   5    Right/Left Knee  Right;Left    Right Knee Extension  0    Right Knee Flexion  130    Left Knee Extension  0    Left Knee Flexion  125      Strength   Strength Assessment Site  Hip;Knee  Right/Left Hip  Right;Left    Right Hip Flexion  4/5    Right Hip Extension  4-/5    Right Hip ABduction  4-/5    Left Hip Flexion  4/5    Left Hip Extension  4-/5    Left Hip ABduction  4-/5    Right/Left Knee  Right;Left    Right Knee Flexion  5/5    Right Knee Extension  4+/5    Left Knee Flexion  4+/5    Left Knee Extension  4+/5       Flexibility   Soft Tissue Assessment /Muscle Length  yes    Hamstrings  Tight bilaterally      Palpation   Patella mobility  Normal    Palpation comment  No TTP      Transfers   Transfers  Independent with all Transfers      Ambulation/Gait   Ambulation/Gait  Yes    Ambulation/Gait Assistance  6: Modified independent (Device/Increase time)    Pre-Gait Activities  Patient exhibit varus alignment bilaterally, decreased knee extension                Objective measurements completed on examination: See above findings.      [facility] PT Treatment/Exercise - 02/12/19 0001      Self-Care   Self-Care  Other Self-Care Comments    Other Self-Care Comments   Golf stance and swing modifications to avoid hip irritation turning feet out to allow better turn      Exercises   Exercises  Knee/Hip      Knee/Hip Exercises: Stretches   Passive Hamstring Stretch  2 reps;30 seconds    Passive Hamstring Stretch Limitations  seated EOB      Knee/Hip Exercises: Supine   Straight Leg Raises  2 sets;15 reps    Straight Leg Raises Limitations  cued for technique      Knee/Hip Exercises: Sidelying   Clams  2x10 with yellow band    Other Sidelying Knee/Hip Exercises  Side lying thoracic rotation mobs x10 each             PT Education - 02/12/19 1126    Education Details  Exam findings, POC, HEP    Person(s) Educated  Patient    Methods  Explanation;Demonstration;Verbal cues;Handout    Comprehension  Verbalized understanding;Returned demonstration;Verbal cues required;Need further instruction       PT Short Term Goals - 02/12/19 1317      PT SHORT TERM GOAL #1   Title  Patient will be independent with intial HEP in order to progress with PT.    Time  4    Period  Weeks    Status  New    Target Date  03/12/19      PT SHORT TERM GOAL #2   Title  Patient will report </= 3/10 left hip and bilat knee pain level with golf.    Time  4    Period  Weeks    Status  New     Target Date  03/12/19        PT Long Term Goals - 02/12/19 1321      PT LONG TERM GOAL #1   Title  Patient will exhibit grossly LE strength of 4+/5 MMT in order to improve ability to go up/down stairs and stand without pain or limitation.    Time  8    Period  Weeks    Status  New    Target Date  04/09/19      PT LONG TERM GOAL #2   Title  Patient will report ability to walk 18 holes of golf with </= 1-2/10 pain level and no residual weakness.    Time  8    Period  Weeks    Status  New    Target Date  04/09/19      PT LONG TERM GOAL #3   Title  Patient will exhibit improved functional level with </= 28% limitation FOTO    Time  8    Period  Weeks    Status  New    Target Date  04/09/19             Plan - 02/12/19 1127    Clinical Impression Statement  Patient presents to PT with report of chronic left hip and bilateral knee pain. He has x-ray confirmed OA and his symptoms seem to fit that diagnosis. He exhibit limited mobility with IR of the hips bilaterally, limited knee extension bilaterally and left knee flexion, tight hamstrings, and strength deficit of the gluts. He would benefit from PT to address his impairments and allow him to return to golf with minimal pain and no limitation.    Personal Factors and Comorbidities  Past/Current Experience;Time since onset of injury/illness/exacerbation    Examination-Activity Limitations  Locomotion Level;Squat;Stairs;Stand;Carry;Lift;Transfers    Examination-Participation Restrictions  Community Activity;Yard Work    Stability/Clinical Decision Making  Stable/Uncomplicated    Clinical Decision Making  Low    Rehab Potential  Good    PT Frequency  1x / week    PT Duration  8 weeks    PT Treatment/Interventions  Cryotherapy;Electrical Stimulation;Moist Heat;Neuromuscular re-education;Balance training;Therapeutic exercise;Therapeutic activities;Stair training;Gait training;Patient/family education;Manual techniques;Dry  needling;Passive range of motion;Taping;Spinal Manipulations;Joint Manipulations    PT Next Visit Plan  Assess HEP and progress PRN, progress glut and LE strengthening, hip and knee motion, golf mechanics.    PT Home Exercise Plan  SLR, clamshell with yellow, seated hamstring stretch, sidelying thoracic rotation stretch    Consulted and Agree with Plan of Care  Patient       Patient will benefit from skilled therapeutic intervention in order to improve the following deficits and impairments:  Abnormal gait, Decreased range of motion, Decreased activity tolerance, Pain, Decreased balance, Impaired flexibility, Postural dysfunction, Decreased strength, Improper body mechanics  Visit Diagnosis: Pain in left hip  Chronic pain of left knee  Chronic pain of right knee  Muscle weakness (generalized)     Problem List Patient Active Problem List   Diagnosis Date Noted  . Unilateral primary osteoarthritis, left knee 01/26/2019  . Unilateral primary osteoarthritis, right knee 01/26/2019  . Unilateral primary osteoarthritis, left hip 01/26/2019  . Paresthesia 12/13/2015  . Abnormality of gait 12/13/2015  . Status post thoracic spinal fusion 12/13/2015  . S/P cervical spinal fusion 01/28/2013  . Obstructive sleep apnea 02/17/2007    Hilda Blades, PT, DPT, LAT, ATC 02/12/19  1:38 PM Phone: [phone] Fax: [facility] [address] [place], [place], [address] Phone: [phone]   Fax:  [phone]  Name: CARSTEN KOCIS MRN: [id number] Date of Birth: 1950/04/21

## 2019-02-16 ENCOUNTER — Encounter: Payer: Self-pay | Admitting: Physical Medicine and Rehabilitation

## 2019-02-16 ENCOUNTER — Ambulatory Visit: Payer: Self-pay

## 2019-02-16 ENCOUNTER — Telehealth: Payer: Self-pay

## 2019-02-16 ENCOUNTER — Ambulatory Visit (INDEPENDENT_AMBULATORY_CARE_PROVIDER_SITE_OTHER): Payer: Medicare PPO | Admitting: Physical Medicine and Rehabilitation

## 2019-02-16 ENCOUNTER — Other Ambulatory Visit: Payer: Self-pay

## 2019-02-16 DIAGNOSIS — M25552 Pain in left hip: Secondary | ICD-10-CM | POA: Diagnosis not present

## 2019-02-16 NOTE — Telephone Encounter (Signed)
Submitted VOB for Monovisc, bilateral knee. 

## 2019-02-16 NOTE — Progress Notes (Signed)
 .  Numeric Pain Rating Scale and Functional Assessment Average Pain 5   In the last MONTH (on 0-10 scale) has pain interfered with the following?  1. General activity like being  able to carry out your everyday physical activities such as walking, climbing stairs, carrying groceries, or moving a chair?  Rating(5)   -Dye Allergies.

## 2019-02-17 DIAGNOSIS — M25552 Pain in left hip: Secondary | ICD-10-CM

## 2019-02-17 MED ORDER — BUPIVACAINE HCL 0.25 % IJ SOLN
4.0000 mL | INTRAMUSCULAR | Status: AC | PRN
  Administered 2019-02-17: 4 mL via INTRA_ARTICULAR

## 2019-02-17 MED ORDER — TRIAMCINOLONE ACETONIDE 40 MG/ML IJ SUSP
40.0000 mg | INTRAMUSCULAR | Status: AC | PRN
  Administered 2019-02-17: 40 mg via INTRA_ARTICULAR

## 2019-02-17 NOTE — Progress Notes (Signed)
   [name] - 69 y.o. male MRN [id number]  Date of birth: December 11, 1950  Office Visit Note: Visit Date: 02/16/2019 PCP: Prince Solian, MD Referred by: Prince Solian, MD  Subjective: Chief Complaint  Patient presents with  . Left Hip - Pain   HPI:  [name] is a 69 y.o. male who comes in today At the request of Dr. Jean Rosenthal for diagnostic hopefully therapeutic anesthetic hip arthrogram on the left.  Patient not having much in way of pain today it seems to be flared up when he is very active.  On exam however he has concordant hip and groin pain with rotation.  ROS Otherwise per HPI.  Assessment & Plan: Visit Diagnoses:  1. Pain in left hip     Plan: Findings:  Postinjection the patient had no pain with hip rotation on exam.  His hip was stiff and does lack range of motion.    Meds & Orders: No orders of the defined types were placed in this encounter.   Orders Placed This Encounter  Procedures  . Large Joint Inj  . C-ARM NO Order    Follow-up: No follow-ups on file.   Procedures: Large Joint Inj: L hip joint on 02/17/2019 5:53 AM Indications: pain and diagnostic evaluation Details: 22 G needle, anterior approach  Arthrogram: Yes  Medications: 4 mL bupivacaine 0.25 %; 40 mg triamcinolone acetonide 40 MG/ML Outcome: tolerated well, no immediate complications  Arthrogram demonstrated excellent flow of contrast throughout the joint surface without extravasation or obvious defect.  The patient had relief of symptoms during the anesthetic phase of the injection.  Procedure, treatment alternatives, risks and benefits explained, specific risks discussed. Consent was given by the patient. Immediately prior to procedure a time out was called to verify the correct patient, procedure, equipment, support staff and site/side marked as required. Patient was prepped and draped in the usual sterile fashion.      No notes on file   Clinical History: No  specialty comments available.     Objective:  VS:  HT:    WT:   BMI:     BP:   HR: bpm  TEMP: ( )  RESP:  Physical Exam  Ortho Exam Imaging: C-ARM NO Order  Result Date: 02/16/2019 Please see Notes tab for imaging impression.

## 2019-02-19 ENCOUNTER — Telehealth: Payer: Self-pay

## 2019-02-19 NOTE — Telephone Encounter (Signed)
PA required for Monovisc, bilateral knee. Submitted PA through [employer] online portal. Currently Pending Review-Tracking#[id number]

## 2019-02-23 ENCOUNTER — Ambulatory Visit: Payer: Medicare Other | Admitting: Orthopaedic Surgery

## 2019-02-23 ENCOUNTER — Telehealth: Payer: Self-pay

## 2019-02-23 ENCOUNTER — Encounter: Payer: Self-pay | Admitting: Orthopaedic Surgery

## 2019-02-23 DIAGNOSIS — M1612 Unilateral primary osteoarthritis, left hip: Secondary | ICD-10-CM | POA: Diagnosis not present

## 2019-02-23 DIAGNOSIS — M1712 Unilateral primary osteoarthritis, left knee: Secondary | ICD-10-CM

## 2019-02-23 DIAGNOSIS — M25562 Pain in left knee: Secondary | ICD-10-CM | POA: Diagnosis not present

## 2019-02-23 DIAGNOSIS — M170 Bilateral primary osteoarthritis of knee: Secondary | ICD-10-CM | POA: Diagnosis not present

## 2019-02-23 DIAGNOSIS — G8929 Other chronic pain: Secondary | ICD-10-CM

## 2019-02-23 DIAGNOSIS — M25561 Pain in right knee: Secondary | ICD-10-CM

## 2019-02-23 DIAGNOSIS — M1711 Unilateral primary osteoarthritis, right knee: Secondary | ICD-10-CM

## 2019-02-23 NOTE — Telephone Encounter (Addendum)
Per [employer], PA is approved. Authorization is only good for one day.  Patient will let us know when he is ready to proceed with gel injection and at that time we will submit for a new authorization for gel injection.  Approved for Monovisc, bilateral knee Buy & Bill Patient is covered at 100% through his insurance. No Co-pay PA required PA Approval# [id number]

## 2019-02-23 NOTE — Progress Notes (Signed)
The patient is returning 1 month after having hyaluronic acid injections in both knees.  He is also had a recent left hip intra-articular steroid injection to treat the pain from osteoarthritis.  He is going to physical therapy to strengthen his lower extremities.  He is an avid golfer and wants to get back to playing golf.  He said so far he is doing well in all 3 joints.  On exam both his left and right hip moves smoothly and fluidly.  Both knees move smoothly and fluidly as well.  I stressed the importance of low impact aerobic activities and quad strengthening exercises.  He understands that we would not repeat an injection in his left hip for least 6 months and that would be only if needed.  His knees can get steroid injections in the spring or summer if needed as well.  Obviously if he is doing well he knows to hold off on coming to see [place].  All question concerns were answered and addressed.  Follow-up as otherwise as needed.

## 2019-02-25 ENCOUNTER — Other Ambulatory Visit: Payer: Self-pay

## 2019-02-25 ENCOUNTER — Ambulatory Visit: Payer: Medicare PPO | Attending: Orthopaedic Surgery | Admitting: Physical Therapy

## 2019-02-25 ENCOUNTER — Encounter: Payer: Self-pay | Admitting: Physical Therapy

## 2019-02-25 DIAGNOSIS — M25561 Pain in right knee: Secondary | ICD-10-CM | POA: Insufficient documentation

## 2019-02-25 DIAGNOSIS — M6281 Muscle weakness (generalized): Secondary | ICD-10-CM

## 2019-02-25 DIAGNOSIS — M25562 Pain in left knee: Secondary | ICD-10-CM | POA: Insufficient documentation

## 2019-02-25 DIAGNOSIS — G8929 Other chronic pain: Secondary | ICD-10-CM

## 2019-02-25 DIAGNOSIS — M25552 Pain in left hip: Secondary | ICD-10-CM | POA: Diagnosis present

## 2019-02-25 NOTE — Therapy (Addendum)
[place], [place], [address] Phone: [phone]   Fax:  [phone]  Physical Therapy Treatment  Discharge  Patient Details  Name: [name] MRN: [id number] Date of Birth: 06-25-1950 Referring Provider (PT): Mcarthur Rossetti, MD   Encounter Date: 02/25/2019  PT End of Session - 02/25/19 1106    Visit Number  2    Number of Visits  6    Date for PT Re-Evaluation  04/09/19    Authorization Type  [employer] MCR    PT Start Time  3903   patient arrived late   PT Stop Time  1130    PT Time Calculation (min)  39 min    Activity Tolerance  Patient tolerated treatment well    Behavior During Therapy  [facility] for tasks assessed/performed       Past Medical History:  Diagnosis Date  . Arthritis   . Back pain   . Balance disorder    post lumbar surgery.  . Cancer ([place])    basal cell CA- removed from back  . Cataract   . Complication of anesthesia    difficullty voiding  . Degenerative disk disease   . Depression   . Headache(784.0)    no longer since cerviacl disc surgery  . Hemorrhoids   . Hyperlipidemia   . Numbness and tingling   . Sleep apnea    uses  C pap   . Spinal stenosis of lumbar region     Past Surgical History:  Procedure Laterality Date  . APPENDECTOMY    . CATARACT EXTRACTION     Left  . cervical disectomy  05/09/2011  . FOOT FUSION     Right great toe  . KNEE ARTHROSCOPY     x 2, bilateral  . LUMBAR DISC SURGERY    . LUMBAR LAMINECTOMY/DECOMPRESSION MICRODISCECTOMY  04/04/2011   Procedure: LUMBAR LAMINECTOMY/DECOMPRESSION MICRODISCECTOMY 2 LEVELS;  Surgeon: Eustace Moore, MD;  Location: [facility];  Service: Neurosurgery;  Laterality: N/A;  lumbar laminectomy lumbar three-four, lumbar four-five  . NASAL SEPTUM SURGERY    . POSTERIOR CERVICAL FUSION/FORAMINOTOMY N/A 01/28/2013   Procedure: POSTERIOR CERVICAL FUSION/FORAMINOTOMY CERVICAL SIX-SEVEN;  Surgeon: Eustace Moore, MD;  Location: [facility];  Service: Neurosurgery;  Laterality: N/A;  . SKIN CANCER EXCISION     2007  . thoracic vertebral decompression      There were no vitals filed for this visit.  Subjective Assessment - 02/25/19 1055    Subjective  Patient reports he had an injection in the left hip and things were going well until he walked 2 rounds of golf consecutive days (the past 2 days), and after the round yesterday he felt a little pain in his left hip but nothing like it was before. He has been consistent with his exercises at home but did not do them on days he played golf.    Pertinent History  Multiple thoracic and cervical surgeries    Limitations  Standing;House hold activities;Other (comment)    How long can you sit comfortably?  No limitation    How long can you stand comfortably?  No limitation    How long can you walk comfortably?  No limitation    Patient Stated Goals  Improved his walking and golfing ability.    Currently in Pain?  No/denies    Pain Score  0-No pain   3-4/10 pain level after golf   Pain Location  Hip  Pain Orientation  Left    Pain Type  Chronic pain    Pain Onset  More than a month ago    Pain Frequency  Intermittent    Aggravating Factors   golf, walking, stairs    Pain Relieving Factors  rest    Effect of Pain on Daily Activities  Patient is limited in walking and golfing    Pain Score  0    Pain Location  Knee    Pain Orientation  Right;Left    Pain Type  Chronic pain    Pain Onset  More than a month ago    Pain Frequency  Intermittent                       [facility] Adult PT Treatment/Exercise - 02/25/19 0001      Exercises   Exercises  Knee/Hip      Knee/Hip Exercises: Stretches   Passive Hamstring Stretch  2 reps;30 seconds    Passive Hamstring Stretch Limitations  seated edge of mat      Knee/Hip Exercises: Machines for Strengthening   Cybex Knee Extension  20# 2x15    Cybex Leg Press  85# 2x15      Knee/Hip  Exercises: Standing   Other Standing Knee Exercises  Lateral band walk with green band just below knees 3x10 - VC for technique to keep feet pointing forward, tension in band, mini-squat      Knee/Hip Exercises: Supine   Bridges  2 sets;10 reps    Bridges Limitations  VC for technique to utilize gluts    Straight Leg Raises  10 reps    Straight Leg Raises Limitations  VC to maintain knee flexion      Knee/Hip Exercises: Sidelying   Clams  2x15 with green    Other Sidelying Knee/Hip Exercises  Side lying thoracic rotation x10 each - VC for technique             PT Education - 02/25/19 1058    Education Details  HEP to improve muscular strength around hips and knees, and improve balance    Person(s) Educated  Patient    Methods  Explanation;Demonstration;Verbal cues;Handout;Tactile cues    Comprehension  Verbalized understanding;Returned demonstration;Verbal cues required;Need further instruction;Tactile cues required       PT Short Term Goals - 02/12/19 1317      PT SHORT TERM GOAL #1   Title  Patient will be independent with intial HEP in order to progress with PT.    Time  4    Period  Weeks    Status  New    Target Date  03/12/19      PT SHORT TERM GOAL #2   Title  Patient will report </= 3/10 left hip and bilat knee pain level with golf.    Time  4    Period  Weeks    Status  New    Target Date  03/12/19        PT Long Term Goals - 02/12/19 1321      PT LONG TERM GOAL #1   Title  Patient will exhibit grossly LE strength of 4+/5 MMT in order to improve ability to go up/down stairs and stand without pain or limitation.    Time  8    Period  Weeks    Status  New    Target Date  04/09/19      PT LONG TERM GOAL #2  Title  Patient will report ability to walk 18 holes of golf with </= 1-2/10 pain level and no residual weakness.    Time  8    Period  Weeks    Status  New    Target Date  04/09/19      PT LONG TERM GOAL #3   Title  Patient will exhibit  improved functional level with </= 28% limitation FOTO    Time  8    Period  Weeks    Status  New    Target Date  04/09/19            Plan - 02/25/19 1120    Clinical Impression Statement  Patient is progressing well with his strengthening exercises and he seems to be improving with his tolerance for golfing. He did have an injection for the left hip which seems to have helped with his ability to walk golfing. He required cueing for form with exercises. He did not report any increased pain with therapy. He would benefit from continued skilled PT to progress strength and control to reduce pain and improve balance with golfing.    PT Treatment/Interventions  Cryotherapy;Electrical Stimulation;Moist Heat;Neuromuscular re-education;Balance training;Therapeutic exercise;Therapeutic activities;Stair training;Gait training;Patient/family education;Manual techniques;Dry needling;Passive range of motion;Taping;Spinal Manipulations;Joint Manipulations    PT Next Visit Plan  Assess HEP and progress PRN, progress glut and LE strengthening, hip and knee motion, golf mechanics.    PT Home Exercise Plan  SLR, clamshell with green, seated hamstring stretch, sidelying thoracic rotation stretch    Consulted and Agree with Plan of Care  Patient       Patient will benefit from skilled therapeutic intervention in order to improve the following deficits and impairments:  Abnormal gait, Decreased range of motion, Decreased activity tolerance, Pain, Decreased balance, Impaired flexibility, Postural dysfunction, Decreased strength, Improper body mechanics  Visit Diagnosis: Pain in left hip  Chronic pain of left knee  Chronic pain of right knee  Muscle weakness (generalized)     Problem List Patient Active Problem List   Diagnosis Date Noted  . Unilateral primary osteoarthritis, left knee 01/26/2019  . Unilateral primary osteoarthritis, right knee 01/26/2019  . Unilateral primary osteoarthritis, left  hip 01/26/2019  . Paresthesia 12/13/2015  . Abnormality of gait 12/13/2015  . Status post thoracic spinal fusion 12/13/2015  . S/P cervical spinal fusion 01/28/2013  . Obstructive sleep apnea 02/17/2007    Hilda Blades, PT, DPT, LAT, ATC 02/25/19  12:28 PM Phone: [phone] Fax: [facility] [address] [place], [place], [address] Phone: [phone]   Fax:  [phone]  Name: [name] MRN: [id number] Date of Birth: February 21, 1950  PHYSICAL THERAPY DISCHARGE SUMMARY  Visits from Start of Care: 2  Current functional level related to goals / functional outcomes: Patient called and stated he did not need PT any longer   Remaining deficits: Unknown   Education / Equipment: HEP Plan: Patient agrees to discharge.  Patient goals were not met. Patient is being discharged due to the patient's request.  ?????    Hilda Blades, PT, DPT 03/27/19  8:49 AM Phone: [phone] Fax: [phone]

## 2019-02-25 NOTE — Patient Instructions (Signed)
Access Code: [email or web address]  URL: [email or web address]  Date: 02/25/2019  Prepared by: Hilda Blades   Exercises Supine Active Straight Leg Raise - 15 reps - 3 sets - 1x daily Clamshell with Resistance - 10 reps - 3 sets - 1x daily Side Stepping with Resistance at Thighs - 10 reps - 3 sets - 1x daily Seated Hamstring Stretch - 3 reps - 20 seconds hold - 1x daily Sidelying Thoracic Lumbar Rotation - 10 reps - 3 sets - 1x daily

## 2019-03-04 ENCOUNTER — Ambulatory Visit: Payer: Medicare PPO | Attending: Internal Medicine

## 2019-03-04 ENCOUNTER — Ambulatory Visit: Payer: Medicare PPO | Admitting: Physical Therapy

## 2019-03-04 DIAGNOSIS — Z23 Encounter for immunization: Secondary | ICD-10-CM

## 2019-03-04 NOTE — Progress Notes (Signed)
   Covid-19 Vaccination Clinic  Name:  [name]    MRN: [id number] DOB: 1950-03-16  03/04/2019  Mr. Corns was observed post Covid-19 immunization for 15 minutes without incidence. He was provided with Vaccine Information Sheet and instruction to access the V-Safe system.   Mr. Mohney was instructed to call 911 with any severe reactions post vaccine: Marland Kitchen Difficulty breathing  . Swelling of your face and throat  . A fast heartbeat  . A bad rash all over your body  . Dizziness and weakness    Immunizations Administered    Name Date Dose VIS Date Route   Pfizer COVID-19 Vaccine 03/04/2019  3:15 PM 0.3 mL 01/23/2019 Intramuscular   Manufacturer: [place]   Lot: [id number]   [place]: [id number]

## 2019-03-11 ENCOUNTER — Encounter: Payer: Medicare Other | Admitting: Physical Therapy

## 2019-03-18 ENCOUNTER — Encounter: Payer: Medicare Other | Admitting: Physical Therapy

## 2019-03-22 ENCOUNTER — Encounter: Payer: Self-pay | Admitting: Internal Medicine

## 2019-03-25 ENCOUNTER — Ambulatory Visit: Payer: Medicare PPO | Attending: Internal Medicine

## 2019-03-25 DIAGNOSIS — Z23 Encounter for immunization: Secondary | ICD-10-CM

## 2019-03-25 NOTE — Progress Notes (Signed)
   Covid-19 Vaccination Clinic  Name:  [name]    MRN: [id number] DOB: Oct 11, 1950  03/25/2019  Mr. [name] was observed post Covid-19 immunization for 15 minutes without incidence. He was provided with Vaccine Information Sheet and instruction to access the V-Safe system.   Mr. [name] was instructed to call 911 with any severe reactions post vaccine: Marland Kitchen Difficulty breathing  . Swelling of your face and throat  . A fast heartbeat  . A bad rash all over your body  . Dizziness and weakness    Immunizations Administered    Name Date Dose VIS Date Route   Pfizer COVID-19 Vaccine 03/25/2019  3:56 PM 0.3 mL 01/23/2019 Intramuscular   Manufacturer: [place]   Lot: [id number]   [place]: [id number]

## 2019-04-22 ENCOUNTER — Ambulatory Visit: Payer: Medicare Other | Admitting: Internal Medicine

## 2019-04-22 ENCOUNTER — Encounter: Payer: Self-pay | Admitting: Internal Medicine

## 2019-04-22 ENCOUNTER — Other Ambulatory Visit: Payer: Self-pay

## 2019-04-22 DIAGNOSIS — G4733 Obstructive sleep apnea (adult) (pediatric): Secondary | ICD-10-CM | POA: Diagnosis not present

## 2019-04-22 DIAGNOSIS — M1711 Unilateral primary osteoarthritis, right knee: Secondary | ICD-10-CM | POA: Diagnosis not present

## 2019-04-22 NOTE — Progress Notes (Signed)
HPI male former smoker followed for OSA, complicated by arthritis in spine  degenerative disc disease/ fusion NPSG 04/04/05 AHI 13/ hr, desaturation to 88%  ---------------------------------------------------------------------------------------------   04/14/2018- 69 year old male former smoker followed for OSA, complicated by arthritis in spine, degenerative disk disease/ fusion VPAP 22 Insp, 17 Exp /  Advanced   He sets his own pressure Download 100% compliance AHI 3.3/hour -----OSA; breathing as baseline; recent change in CPAP machine, recently changed settings to 22 & 17 Replacement machine last year and reset the pressures center.  He says this is very comfortable and he thinks he sleeps well with nocturia x1.  He still tends to get very sleepy if he sits quietly and relaxing.  Discussed naps, use of caffeine and availability of prescription stimulants if needed.  He feels this problem is much better than before he began using CPAP.  This sleepiness does not affect his driving or quality of life issues.  04/22/19- 69 year old male former smoker followed for OSA, complicated by arthritis in spine, degenerative disk disease/ fusion VPAP 17 Insp, 12 Exp /  Adapt   He sets his own pressure Download compliance 100%, AHI 2.2/ hr Body weight today 212 lbs -----f/u OSA. Breathing is at his baseline.  Had flu vax and 2 Covid vax Doing very well with PAP. Sleeps 8 hours. Still notes drowsy sometimes if sits quietly. Discussed use of caffeine, safe driving, naps. May be pending replacement of knees and hips.   ROS-see HPI   + = positive Constitutional:   No-   weight loss, night sweats, fevers, chills,+ fatigue, lassitude. HEENT:   No-  headaches, difficulty swallowing, tooth/dental problems, sore throat,       No-  sneezing, itching, ear ache, nasal congestion, post nasal drip,  CV:  No-   chest pain, orthopnea, PND, swelling in lower extremities, anasarca, dizziness, palpitations Resp: No-    shortness of breath with exertion or at rest.              No-   productive cough,  No non-productive cough,  No- coughing up of blood.              No-   change in color of mucus.  No- wheezing.   Skin: No-   rash or lesions. GI:  No-   heartburn, indigestion, abdominal pain, nausea, vomiting,  GU:. MS:  + joint pain or swelling.  + Chronic back pain. Neuro-     nothing unusual Psych:  No- change in mood or affect. No depression or anxiety.  No memory loss.  OBJ General- Alert, Oriented, Affect-appropriate, Distress- none acute, tall, trim, comfortable appearing. Skin- rash-none, lesions- none, excoriation- none Lymphadenopathy- none Head- atraumatic            Eyes- Gross vision intact, PERRLA, conjunctivae clear secretions            Ears- Hearing, canals-normal            Nose- Clear, no-Septal dev, mucus, polyps, erosion, perforation             Throat- Mallampati II-III , mucosa clear , drainage- none, tonsils- atrophic Neck- flexible , trachea midline, no stridor , thyroid nl, carotid no bruit Chest - symmetrical excursion , unlabored           Heart/CV- RRR , no murmur , no gallop  , no rub, nl s1 s2                           -  JVD- none , edema- none, stasis changes- none, varices- none           Lung- clear to P&A, wheeze- none, cough- none , dullness-none, rub- none           Chest wall-  Abd- Br/ Gen/ Rectal- Not done, not indicated Extrem- cyanosis- none, clubbing, none, atrophy- none, strength- nl. Moves stiffly Neuro- grossly intact to observation

## 2019-04-22 NOTE — Patient Instructions (Signed)
We can continue VPAP 17/12, mask of choice,humidifier, supplies, [employer]/card  Please call if we can help

## 2019-04-22 NOTE — Assessment & Plan Note (Signed)
His sleep apnea with VPAP control shouldn't be a barrier to necessary surgery.

## 2019-04-22 NOTE — Assessment & Plan Note (Signed)
Benefits with good compliance and control verified by download. Residual sense that he is drowsy if sitting quietly, may respond to naps and appropriate caffeine. Driving safety reviewed. Consider prescription stimulant if needed.

## 2019-05-19 ENCOUNTER — Encounter (HOSPITAL_COMMUNITY): Payer: Self-pay | Admitting: Emergency Medicine

## 2019-05-19 ENCOUNTER — Emergency Department (HOSPITAL_COMMUNITY)
Admission: EM | Admit: 2019-05-19 | Discharge: 2019-05-19 | Disposition: A | Discharge status: Home / Self Care | Payer: Medicare PPO | Attending: Emergency Medicine | Admitting: Emergency Medicine

## 2019-05-19 ENCOUNTER — Emergency Department (HOSPITAL_COMMUNITY): Payer: Medicare PPO

## 2019-05-19 DIAGNOSIS — Z79899 Other long term (current) drug therapy: Secondary | ICD-10-CM | POA: Diagnosis not present

## 2019-05-19 DIAGNOSIS — M25531 Pain in right wrist: Secondary | ICD-10-CM | POA: Insufficient documentation

## 2019-05-19 DIAGNOSIS — Z87891 Personal history of nicotine dependence: Secondary | ICD-10-CM | POA: Diagnosis not present

## 2019-05-19 DIAGNOSIS — Z7982 Long term (current) use of aspirin: Secondary | ICD-10-CM | POA: Insufficient documentation

## 2019-05-19 DIAGNOSIS — M79641 Pain in right hand: Secondary | ICD-10-CM | POA: Insufficient documentation

## 2019-05-19 DIAGNOSIS — Z85828 Personal history of other malignant neoplasm of skin: Secondary | ICD-10-CM | POA: Insufficient documentation
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
## 2019-05-19 NOTE — Discharge Instructions (Addendum)
Return if any problems.  See your Physician for recheck  °
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
## 2019-06-23 ENCOUNTER — Telehealth: Payer: Self-pay | Admitting: Orthopaedic Surgery

## 2019-06-23 NOTE — Telephone Encounter (Signed)
Received vm from patient wanting records sent to D. Alvan Dame w/ [facility]. IC,lmvm advised he will need to sign a release before we can send records.

## 2019-07-07 ENCOUNTER — Telehealth: Payer: Self-pay | Admitting: Orthopaedic Surgery

## 2019-07-07 NOTE — Telephone Encounter (Signed)
Received vm fom patient stating needs xray CD. IC,lmvm advised that he will need to complete and sign release form and once we receive that we will call him when CD is ready.

## 2019-07-08 ENCOUNTER — Telehealth: Payer: Self-pay | Admitting: Orthopaedic Surgery

## 2019-07-08 NOTE — Telephone Encounter (Signed)
Please copy all xrays to CD. I am gathering records also. I can come get CD when ready. Thanks!

## 2019-07-10 DIAGNOSIS — M1612 Unilateral primary osteoarthritis, left hip: Secondary | ICD-10-CM | POA: Diagnosis not present

## 2019-07-10 DIAGNOSIS — M25561 Pain in right knee: Secondary | ICD-10-CM | POA: Diagnosis not present

## 2019-07-10 DIAGNOSIS — M25562 Pain in left knee: Secondary | ICD-10-CM | POA: Diagnosis not present

## 2019-07-10 DIAGNOSIS — M1711 Unilateral primary osteoarthritis, right knee: Secondary | ICD-10-CM | POA: Diagnosis not present

## 2019-07-10 DIAGNOSIS — M1712 Unilateral primary osteoarthritis, left knee: Secondary | ICD-10-CM | POA: Diagnosis not present

## 2019-08-18 DIAGNOSIS — M79641 Pain in right hand: Secondary | ICD-10-CM | POA: Diagnosis not present

## 2019-09-07 ENCOUNTER — Encounter (HOSPITAL_COMMUNITY): Payer: Medicare PPO

## 2019-09-15 ENCOUNTER — Ambulatory Visit: Admit: 2019-09-15 | Payer: Medicare PPO | Admitting: Orthopedic Surgery

## 2019-09-15 SURGERY — ARTHROPLASTY, HIP, TOTAL, ANTERIOR APPROACH
Anesthesia: Spinal | Site: Hip | Laterality: Left

## 2019-10-14 DIAGNOSIS — E291 Testicular hypofunction: Secondary | ICD-10-CM | POA: Diagnosis not present

## 2019-10-14 DIAGNOSIS — Z125 Encounter for screening for malignant neoplasm of prostate: Secondary | ICD-10-CM | POA: Diagnosis not present

## 2019-10-14 DIAGNOSIS — E785 Hyperlipidemia, unspecified: Secondary | ICD-10-CM | POA: Diagnosis not present

## 2019-10-22 DIAGNOSIS — R972 Elevated prostate specific antigen [PSA]: Secondary | ICD-10-CM | POA: Diagnosis not present

## 2019-10-22 DIAGNOSIS — M5380 Other specified dorsopathies, site unspecified: Secondary | ICD-10-CM | POA: Diagnosis not present

## 2019-10-22 DIAGNOSIS — Z1212 Encounter for screening for malignant neoplasm of rectum: Secondary | ICD-10-CM | POA: Diagnosis not present

## 2019-10-22 DIAGNOSIS — R413 Other amnesia: Secondary | ICD-10-CM | POA: Diagnosis not present

## 2019-10-22 DIAGNOSIS — E785 Hyperlipidemia, unspecified: Secondary | ICD-10-CM | POA: Diagnosis not present

## 2019-10-22 DIAGNOSIS — R82998 Other abnormal findings in urine: Secondary | ICD-10-CM | POA: Diagnosis not present

## 2019-10-22 DIAGNOSIS — F39 Unspecified mood [affective] disorder: Secondary | ICD-10-CM | POA: Diagnosis not present

## 2019-10-22 DIAGNOSIS — R209 Unspecified disturbances of skin sensation: Secondary | ICD-10-CM | POA: Diagnosis not present

## 2019-10-22 DIAGNOSIS — D692 Other nonthrombocytopenic purpura: Secondary | ICD-10-CM | POA: Diagnosis not present

## 2019-10-22 DIAGNOSIS — Z0000 Encounter for general adult medical examination without abnormal findings: Secondary | ICD-10-CM | POA: Diagnosis not present

## 2019-10-22 DIAGNOSIS — R945 Abnormal results of liver function studies: Secondary | ICD-10-CM | POA: Diagnosis not present

## 2019-11-25 DIAGNOSIS — R945 Abnormal results of liver function studies: Secondary | ICD-10-CM | POA: Diagnosis not present

## 2019-11-26 DIAGNOSIS — E291 Testicular hypofunction: Secondary | ICD-10-CM | POA: Diagnosis not present

## 2020-01-27 DIAGNOSIS — G8929 Other chronic pain: Secondary | ICD-10-CM | POA: Diagnosis not present

## 2020-01-27 DIAGNOSIS — Z79899 Other long term (current) drug therapy: Secondary | ICD-10-CM | POA: Diagnosis not present

## 2020-01-27 DIAGNOSIS — M160 Bilateral primary osteoarthritis of hip: Secondary | ICD-10-CM | POA: Diagnosis not present

## 2020-01-27 DIAGNOSIS — M25551 Pain in right hip: Secondary | ICD-10-CM | POA: Diagnosis not present

## 2020-01-27 DIAGNOSIS — M25552 Pain in left hip: Secondary | ICD-10-CM | POA: Diagnosis not present

## 2020-01-27 DIAGNOSIS — M47816 Spondylosis without myelopathy or radiculopathy, lumbar region: Secondary | ICD-10-CM | POA: Diagnosis not present

## 2020-01-29 DIAGNOSIS — G4733 Obstructive sleep apnea (adult) (pediatric): Secondary | ICD-10-CM | POA: Diagnosis not present

## 2020-02-23 DIAGNOSIS — M21162 Varus deformity, not elsewhere classified, left knee: Secondary | ICD-10-CM | POA: Diagnosis not present

## 2020-02-23 DIAGNOSIS — M25552 Pain in left hip: Secondary | ICD-10-CM | POA: Diagnosis not present

## 2020-02-23 DIAGNOSIS — M25551 Pain in right hip: Secondary | ICD-10-CM | POA: Diagnosis not present

## 2020-02-23 DIAGNOSIS — M170 Bilateral primary osteoarthritis of knee: Secondary | ICD-10-CM | POA: Diagnosis not present

## 2020-02-23 DIAGNOSIS — G8929 Other chronic pain: Secondary | ICD-10-CM | POA: Diagnosis not present

## 2020-02-23 DIAGNOSIS — M21161 Varus deformity, not elsewhere classified, right knee: Secondary | ICD-10-CM | POA: Diagnosis not present

## 2020-02-23 DIAGNOSIS — M166 Other bilateral secondary osteoarthritis of hip: Secondary | ICD-10-CM | POA: Diagnosis not present

## 2020-03-07 DIAGNOSIS — E349 Endocrine disorder, unspecified: Secondary | ICD-10-CM | POA: Diagnosis not present

## 2020-03-07 DIAGNOSIS — R5383 Other fatigue: Secondary | ICD-10-CM | POA: Diagnosis not present

## 2020-03-07 DIAGNOSIS — G4733 Obstructive sleep apnea (adult) (pediatric): Secondary | ICD-10-CM | POA: Diagnosis not present

## 2020-03-31 DIAGNOSIS — H35371 Puckering of macula, right eye: Secondary | ICD-10-CM | POA: Diagnosis not present

## 2020-04-15 DIAGNOSIS — H33001 Unspecified retinal detachment with retinal break, right eye: Secondary | ICD-10-CM | POA: Diagnosis not present

## 2020-04-15 DIAGNOSIS — H43813 Vitreous degeneration, bilateral: Secondary | ICD-10-CM | POA: Diagnosis not present

## 2020-04-15 DIAGNOSIS — Z961 Presence of intraocular lens: Secondary | ICD-10-CM | POA: Diagnosis not present

## 2020-04-15 DIAGNOSIS — H35373 Puckering of macula, bilateral: Secondary | ICD-10-CM | POA: Diagnosis not present

## 2020-04-21 ENCOUNTER — Ambulatory Visit: Payer: Medicare PPO | Admitting: Internal Medicine

## 2020-04-28 DIAGNOSIS — Z4789 Encounter for other orthopedic aftercare: Secondary | ICD-10-CM | POA: Diagnosis not present

## 2020-04-28 DIAGNOSIS — M160 Bilateral primary osteoarthritis of hip: Secondary | ICD-10-CM | POA: Diagnosis not present

## 2020-04-28 DIAGNOSIS — M25552 Pain in left hip: Secondary | ICD-10-CM | POA: Diagnosis not present

## 2020-05-11 DIAGNOSIS — Z4789 Encounter for other orthopedic aftercare: Secondary | ICD-10-CM | POA: Diagnosis not present

## 2020-05-11 DIAGNOSIS — M25552 Pain in left hip: Secondary | ICD-10-CM | POA: Diagnosis not present

## 2020-05-11 DIAGNOSIS — M1612 Unilateral primary osteoarthritis, left hip: Secondary | ICD-10-CM | POA: Diagnosis not present

## 2020-05-27 DIAGNOSIS — M160 Bilateral primary osteoarthritis of hip: Secondary | ICD-10-CM | POA: Diagnosis not present

## 2020-06-21 DIAGNOSIS — Z471 Aftercare following joint replacement surgery: Secondary | ICD-10-CM | POA: Diagnosis not present

## 2020-06-21 DIAGNOSIS — Z96642 Presence of left artificial hip joint: Secondary | ICD-10-CM | POA: Diagnosis not present

## 2020-07-01 DIAGNOSIS — L820 Inflamed seborrheic keratosis: Secondary | ICD-10-CM | POA: Diagnosis not present

## 2020-07-01 DIAGNOSIS — L814 Other melanin hyperpigmentation: Secondary | ICD-10-CM | POA: Diagnosis not present

## 2020-07-01 DIAGNOSIS — L570 Actinic keratosis: Secondary | ICD-10-CM | POA: Diagnosis not present

## 2020-07-01 DIAGNOSIS — L821 Other seborrheic keratosis: Secondary | ICD-10-CM | POA: Diagnosis not present

## 2020-07-25 DIAGNOSIS — H33001 Unspecified retinal detachment with retinal break, right eye: Secondary | ICD-10-CM | POA: Diagnosis not present

## 2020-07-25 DIAGNOSIS — Z961 Presence of intraocular lens: Secondary | ICD-10-CM | POA: Diagnosis not present

## 2020-07-25 DIAGNOSIS — H43813 Vitreous degeneration, bilateral: Secondary | ICD-10-CM | POA: Diagnosis not present

## 2020-07-25 DIAGNOSIS — H35373 Puckering of macula, bilateral: Secondary | ICD-10-CM | POA: Diagnosis not present

## 2020-07-25 DIAGNOSIS — H33021 Retinal detachment with multiple breaks, right eye: Secondary | ICD-10-CM | POA: Diagnosis not present

## 2020-07-27 DIAGNOSIS — G8929 Other chronic pain: Secondary | ICD-10-CM | POA: Diagnosis not present

## 2020-07-27 DIAGNOSIS — M19042 Primary osteoarthritis, left hand: Secondary | ICD-10-CM | POA: Diagnosis not present

## 2020-07-27 DIAGNOSIS — Z981 Arthrodesis status: Secondary | ICD-10-CM | POA: Diagnosis not present

## 2020-07-27 DIAGNOSIS — H33011 Retinal detachment with single break, right eye: Secondary | ICD-10-CM | POA: Diagnosis not present

## 2020-07-27 DIAGNOSIS — M479 Spondylosis, unspecified: Secondary | ICD-10-CM | POA: Diagnosis not present

## 2020-07-27 DIAGNOSIS — H33021 Retinal detachment with multiple breaks, right eye: Secondary | ICD-10-CM | POA: Diagnosis not present

## 2020-07-27 DIAGNOSIS — M19041 Primary osteoarthritis, right hand: Secondary | ICD-10-CM | POA: Diagnosis not present

## 2020-07-27 DIAGNOSIS — H43813 Vitreous degeneration, bilateral: Secondary | ICD-10-CM | POA: Diagnosis not present

## 2020-07-27 DIAGNOSIS — M170 Bilateral primary osteoarthritis of knee: Secondary | ICD-10-CM | POA: Diagnosis not present

## 2020-07-27 DIAGNOSIS — G4730 Sleep apnea, unspecified: Secondary | ICD-10-CM | POA: Diagnosis not present

## 2020-08-09 DIAGNOSIS — H33021 Retinal detachment with multiple breaks, right eye: Secondary | ICD-10-CM | POA: Diagnosis not present

## 2020-08-23 DIAGNOSIS — G4733 Obstructive sleep apnea (adult) (pediatric): Secondary | ICD-10-CM | POA: Diagnosis not present

## 2020-08-23 DIAGNOSIS — M170 Bilateral primary osteoarthritis of knee: Secondary | ICD-10-CM | POA: Diagnosis not present

## 2020-08-23 DIAGNOSIS — Z9989 Dependence on other enabling machines and devices: Secondary | ICD-10-CM | POA: Diagnosis not present

## 2020-08-23 DIAGNOSIS — Z01818 Encounter for other preprocedural examination: Secondary | ICD-10-CM | POA: Diagnosis not present

## 2020-08-23 DIAGNOSIS — F32A Depression, unspecified: Secondary | ICD-10-CM | POA: Diagnosis not present

## 2020-08-23 DIAGNOSIS — E559 Vitamin D deficiency, unspecified: Secondary | ICD-10-CM | POA: Diagnosis not present

## 2020-08-30 DIAGNOSIS — G8929 Other chronic pain: Secondary | ICD-10-CM | POA: Diagnosis not present

## 2020-08-30 DIAGNOSIS — G4733 Obstructive sleep apnea (adult) (pediatric): Secondary | ICD-10-CM | POA: Diagnosis not present

## 2020-08-30 DIAGNOSIS — M25561 Pain in right knee: Secondary | ICD-10-CM | POA: Diagnosis not present

## 2020-09-02 DIAGNOSIS — Z981 Arthrodesis status: Secondary | ICD-10-CM | POA: Diagnosis not present

## 2020-09-02 DIAGNOSIS — M47816 Spondylosis without myelopathy or radiculopathy, lumbar region: Secondary | ICD-10-CM | POA: Diagnosis not present

## 2020-09-02 DIAGNOSIS — M549 Dorsalgia, unspecified: Secondary | ICD-10-CM | POA: Diagnosis not present

## 2020-09-02 DIAGNOSIS — M4322 Fusion of spine, cervical region: Secondary | ICD-10-CM | POA: Diagnosis not present

## 2020-09-02 DIAGNOSIS — M4316 Spondylolisthesis, lumbar region: Secondary | ICD-10-CM | POA: Diagnosis not present

## 2020-09-02 DIAGNOSIS — Z9889 Other specified postprocedural states: Secondary | ICD-10-CM | POA: Diagnosis not present

## 2020-09-02 DIAGNOSIS — M5136 Other intervertebral disc degeneration, lumbar region: Secondary | ICD-10-CM | POA: Diagnosis not present

## 2020-09-02 DIAGNOSIS — M5134 Other intervertebral disc degeneration, thoracic region: Secondary | ICD-10-CM | POA: Diagnosis not present

## 2020-09-02 DIAGNOSIS — M546 Pain in thoracic spine: Secondary | ICD-10-CM | POA: Diagnosis not present

## 2020-09-02 DIAGNOSIS — Z96642 Presence of left artificial hip joint: Secondary | ICD-10-CM | POA: Diagnosis not present

## 2020-09-17 DIAGNOSIS — S92014A Nondisplaced fracture of body of right calcaneus, initial encounter for closed fracture: Secondary | ICD-10-CM | POA: Diagnosis not present

## 2020-09-17 DIAGNOSIS — M47817 Spondylosis without myelopathy or radiculopathy, lumbosacral region: Secondary | ICD-10-CM | POA: Diagnosis not present

## 2020-09-17 DIAGNOSIS — M79671 Pain in right foot: Secondary | ICD-10-CM | POA: Diagnosis not present

## 2020-09-17 DIAGNOSIS — M25551 Pain in right hip: Secondary | ICD-10-CM | POA: Diagnosis not present

## 2020-09-17 DIAGNOSIS — M48061 Spinal stenosis, lumbar region without neurogenic claudication: Secondary | ICD-10-CM | POA: Diagnosis not present

## 2020-09-17 DIAGNOSIS — S79911A Unspecified injury of right hip, initial encounter: Secondary | ICD-10-CM | POA: Diagnosis not present

## 2020-09-17 DIAGNOSIS — Z87891 Personal history of nicotine dependence: Secondary | ICD-10-CM | POA: Diagnosis not present

## 2020-09-17 DIAGNOSIS — W1789XA Other fall from one level to another, initial encounter: Secondary | ICD-10-CM | POA: Diagnosis not present

## 2020-09-17 DIAGNOSIS — Z96642 Presence of left artificial hip joint: Secondary | ICD-10-CM | POA: Diagnosis not present

## 2020-09-17 DIAGNOSIS — M1611 Unilateral primary osteoarthritis, right hip: Secondary | ICD-10-CM | POA: Diagnosis not present

## 2020-09-17 DIAGNOSIS — W11XXXA Fall on and from ladder, initial encounter: Secondary | ICD-10-CM | POA: Diagnosis not present

## 2020-09-17 DIAGNOSIS — Y33XXXA Other specified events, undetermined intent, initial encounter: Secondary | ICD-10-CM | POA: Diagnosis not present

## 2020-09-17 DIAGNOSIS — S92001A Unspecified fracture of right calcaneus, initial encounter for closed fracture: Secondary | ICD-10-CM | POA: Diagnosis not present

## 2020-09-18 DIAGNOSIS — Y33XXXA Other specified events, undetermined intent, initial encounter: Secondary | ICD-10-CM | POA: Diagnosis not present

## 2020-09-18 DIAGNOSIS — S79911A Unspecified injury of right hip, initial encounter: Secondary | ICD-10-CM | POA: Diagnosis not present

## 2020-09-18 DIAGNOSIS — Z96642 Presence of left artificial hip joint: Secondary | ICD-10-CM | POA: Diagnosis not present

## 2020-09-20 DIAGNOSIS — S92041A Displaced other fracture of tuberosity of right calcaneus, initial encounter for closed fracture: Secondary | ICD-10-CM | POA: Diagnosis not present

## 2020-09-23 DIAGNOSIS — H35373 Puckering of macula, bilateral: Secondary | ICD-10-CM | POA: Diagnosis not present

## 2020-09-30 DIAGNOSIS — S92041A Displaced other fracture of tuberosity of right calcaneus, initial encounter for closed fracture: Secondary | ICD-10-CM | POA: Diagnosis not present

## 2020-09-30 DIAGNOSIS — S92041D Displaced other fracture of tuberosity of right calcaneus, subsequent encounter for fracture with routine healing: Secondary | ICD-10-CM | POA: Diagnosis not present

## 2020-10-21 DIAGNOSIS — S92041D Displaced other fracture of tuberosity of right calcaneus, subsequent encounter for fracture with routine healing: Secondary | ICD-10-CM | POA: Diagnosis not present

## 2020-11-11 DIAGNOSIS — S92041D Displaced other fracture of tuberosity of right calcaneus, subsequent encounter for fracture with routine healing: Secondary | ICD-10-CM | POA: Diagnosis not present

## 2020-11-17 DIAGNOSIS — M175 Other unilateral secondary osteoarthritis of knee: Secondary | ICD-10-CM | POA: Diagnosis not present

## 2020-11-30 DIAGNOSIS — J3489 Other specified disorders of nose and nasal sinuses: Secondary | ICD-10-CM | POA: Diagnosis not present

## 2020-11-30 DIAGNOSIS — R0981 Nasal congestion: Secondary | ICD-10-CM | POA: Diagnosis not present

## 2020-11-30 DIAGNOSIS — F5101 Primary insomnia: Secondary | ICD-10-CM | POA: Diagnosis not present

## 2020-12-05 DIAGNOSIS — F32A Depression, unspecified: Secondary | ICD-10-CM | POA: Diagnosis not present

## 2020-12-05 DIAGNOSIS — Z9989 Dependence on other enabling machines and devices: Secondary | ICD-10-CM | POA: Diagnosis not present

## 2020-12-05 DIAGNOSIS — M170 Bilateral primary osteoarthritis of knee: Secondary | ICD-10-CM | POA: Diagnosis not present

## 2020-12-05 DIAGNOSIS — G4733 Obstructive sleep apnea (adult) (pediatric): Secondary | ICD-10-CM | POA: Diagnosis not present

## 2020-12-05 DIAGNOSIS — Z01818 Encounter for other preprocedural examination: Secondary | ICD-10-CM | POA: Diagnosis not present

## 2020-12-09 DIAGNOSIS — S92041D Displaced other fracture of tuberosity of right calcaneus, subsequent encounter for fracture with routine healing: Secondary | ICD-10-CM | POA: Diagnosis not present

## 2020-12-22 DIAGNOSIS — G8918 Other acute postprocedural pain: Secondary | ICD-10-CM | POA: Diagnosis not present

## 2020-12-22 DIAGNOSIS — M1712 Unilateral primary osteoarthritis, left knee: Secondary | ICD-10-CM | POA: Diagnosis not present

## 2020-12-22 DIAGNOSIS — Z4789 Encounter for other orthopedic aftercare: Secondary | ICD-10-CM | POA: Diagnosis not present

## 2020-12-23 DIAGNOSIS — Z961 Presence of intraocular lens: Secondary | ICD-10-CM | POA: Diagnosis not present

## 2020-12-23 DIAGNOSIS — H33021 Retinal detachment with multiple breaks, right eye: Secondary | ICD-10-CM | POA: Diagnosis not present

## 2020-12-23 DIAGNOSIS — H35373 Puckering of macula, bilateral: Secondary | ICD-10-CM | POA: Diagnosis not present

## 2021-01-11 DIAGNOSIS — H5211 Myopia, right eye: Secondary | ICD-10-CM | POA: Diagnosis not present

## 2021-01-25 DIAGNOSIS — H43813 Vitreous degeneration, bilateral: Secondary | ICD-10-CM | POA: Diagnosis not present

## 2021-01-25 DIAGNOSIS — H35371 Puckering of macula, right eye: Secondary | ICD-10-CM | POA: Diagnosis not present

## 2021-02-01 DIAGNOSIS — H35371 Puckering of macula, right eye: Secondary | ICD-10-CM | POA: Diagnosis not present

## 2021-02-01 DIAGNOSIS — H5021 Vertical strabismus, right eye: Secondary | ICD-10-CM | POA: Diagnosis not present

## 2021-02-01 DIAGNOSIS — H33021 Retinal detachment with multiple breaks, right eye: Secondary | ICD-10-CM | POA: Diagnosis not present

## 2021-02-01 DIAGNOSIS — H532 Diplopia: Secondary | ICD-10-CM | POA: Diagnosis not present

## 2021-02-02 DIAGNOSIS — Z471 Aftercare following joint replacement surgery: Secondary | ICD-10-CM | POA: Diagnosis not present

## 2021-02-02 DIAGNOSIS — Z96652 Presence of left artificial knee joint: Secondary | ICD-10-CM | POA: Diagnosis not present

## 2021-03-02 DIAGNOSIS — R3912 Poor urinary stream: Secondary | ICD-10-CM | POA: Diagnosis not present

## 2021-03-02 DIAGNOSIS — E782 Mixed hyperlipidemia: Secondary | ICD-10-CM | POA: Diagnosis not present

## 2021-03-02 DIAGNOSIS — R7309 Other abnormal glucose: Secondary | ICD-10-CM | POA: Diagnosis not present

## 2021-03-02 DIAGNOSIS — Z0000 Encounter for general adult medical examination without abnormal findings: Secondary | ICD-10-CM | POA: Diagnosis not present

## 2021-03-02 DIAGNOSIS — F5101 Primary insomnia: Secondary | ICD-10-CM | POA: Diagnosis not present

## 2021-03-02 DIAGNOSIS — E349 Endocrine disorder, unspecified: Secondary | ICD-10-CM | POA: Diagnosis not present

## 2021-03-02 DIAGNOSIS — N401 Enlarged prostate with lower urinary tract symptoms: Secondary | ICD-10-CM | POA: Diagnosis not present

## 2021-03-08 DIAGNOSIS — M160 Bilateral primary osteoarthritis of hip: Secondary | ICD-10-CM | POA: Diagnosis not present

## 2021-03-08 DIAGNOSIS — F5101 Primary insomnia: Secondary | ICD-10-CM | POA: Diagnosis not present

## 2021-03-08 DIAGNOSIS — G4733 Obstructive sleep apnea (adult) (pediatric): Secondary | ICD-10-CM | POA: Diagnosis not present

## 2021-03-08 DIAGNOSIS — Z9989 Dependence on other enabling machines and devices: Secondary | ICD-10-CM | POA: Diagnosis not present

## 2021-03-08 DIAGNOSIS — Z01818 Encounter for other preprocedural examination: Secondary | ICD-10-CM | POA: Diagnosis not present

## 2021-03-08 DIAGNOSIS — E782 Mixed hyperlipidemia: Secondary | ICD-10-CM | POA: Diagnosis not present

## 2021-03-08 DIAGNOSIS — E349 Endocrine disorder, unspecified: Secondary | ICD-10-CM | POA: Diagnosis not present

## 2021-03-08 DIAGNOSIS — M170 Bilateral primary osteoarthritis of knee: Secondary | ICD-10-CM | POA: Diagnosis not present

## 2021-03-08 DIAGNOSIS — Z96652 Presence of left artificial knee joint: Secondary | ICD-10-CM | POA: Diagnosis not present

## 2021-03-10 DIAGNOSIS — S92041D Displaced other fracture of tuberosity of right calcaneus, subsequent encounter for fracture with routine healing: Secondary | ICD-10-CM | POA: Diagnosis not present

## 2021-03-22 DIAGNOSIS — Z9841 Cataract extraction status, right eye: Secondary | ICD-10-CM | POA: Diagnosis not present

## 2021-03-22 DIAGNOSIS — M1990 Unspecified osteoarthritis, unspecified site: Secondary | ICD-10-CM | POA: Diagnosis not present

## 2021-03-22 DIAGNOSIS — H5021 Vertical strabismus, right eye: Secondary | ICD-10-CM | POA: Diagnosis not present

## 2021-03-22 DIAGNOSIS — Z79899 Other long term (current) drug therapy: Secondary | ICD-10-CM | POA: Diagnosis not present

## 2021-03-22 DIAGNOSIS — H532 Diplopia: Secondary | ICD-10-CM | POA: Diagnosis not present

## 2021-03-22 DIAGNOSIS — Z9842 Cataract extraction status, left eye: Secondary | ICD-10-CM | POA: Diagnosis not present

## 2021-03-22 DIAGNOSIS — G4733 Obstructive sleep apnea (adult) (pediatric): Secondary | ICD-10-CM | POA: Diagnosis not present

## 2021-03-22 DIAGNOSIS — Z87891 Personal history of nicotine dependence: Secondary | ICD-10-CM | POA: Diagnosis not present

## 2021-03-22 DIAGNOSIS — Z8669 Personal history of other diseases of the nervous system and sense organs: Secondary | ICD-10-CM | POA: Diagnosis not present

## 2021-03-22 DIAGNOSIS — H5022 Vertical strabismus, left eye: Secondary | ICD-10-CM | POA: Diagnosis not present

## 2021-03-29 DIAGNOSIS — G8918 Other acute postprocedural pain: Secondary | ICD-10-CM | POA: Diagnosis not present

## 2021-03-29 DIAGNOSIS — Z4789 Encounter for other orthopedic aftercare: Secondary | ICD-10-CM | POA: Diagnosis not present

## 2021-03-29 DIAGNOSIS — M1711 Unilateral primary osteoarthritis, right knee: Secondary | ICD-10-CM | POA: Diagnosis not present
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
